# Patient Record
Sex: Female | Born: 1955 | Race: Black or African American | Hispanic: No | Marital: Single | Smoking: Current every day smoker
Health system: Southern US, Community
[De-identification: ages and names within clinical notes are randomized; demographics above are authoritative.]

## PROBLEM LIST (undated history)

## (undated) DIAGNOSIS — E785 Hyperlipidemia, unspecified: Secondary | ICD-10-CM

## (undated) DIAGNOSIS — M1990 Unspecified osteoarthritis, unspecified site: Secondary | ICD-10-CM

## (undated) DIAGNOSIS — T7840XA Allergy, unspecified, initial encounter: Secondary | ICD-10-CM

## (undated) DIAGNOSIS — J449 Chronic obstructive pulmonary disease, unspecified: Secondary | ICD-10-CM

## (undated) DIAGNOSIS — I10 Essential (primary) hypertension: Secondary | ICD-10-CM

## (undated) DIAGNOSIS — K5792 Diverticulitis of intestine, part unspecified, without perforation or abscess without bleeding: Secondary | ICD-10-CM

## (undated) DIAGNOSIS — H269 Unspecified cataract: Secondary | ICD-10-CM

## (undated) HISTORY — DX: Hyperlipidemia, unspecified: E78.5

## (undated) HISTORY — DX: Diverticulitis of intestine, part unspecified, without perforation or abscess without bleeding: K57.92

## (undated) HISTORY — DX: Allergy, unspecified, initial encounter: T78.40XA

## (undated) HISTORY — DX: Chronic obstructive pulmonary disease, unspecified: J44.9

## (undated) HISTORY — DX: Unspecified cataract: H26.9

## (undated) HISTORY — PX: ABDOMINAL HYSTERECTOMY: SHX81

## (undated) HISTORY — DX: Unspecified osteoarthritis, unspecified site: M19.90

---

## 1997-06-16 ENCOUNTER — Other Ambulatory Visit: Admission: RE | Admit: 1997-06-16 | Discharge: 1997-06-16 | Payer: Self-pay | Admitting: *Deleted

## 2000-05-11 ENCOUNTER — Other Ambulatory Visit: Admission: RE | Admit: 2000-05-11 | Discharge: 2000-05-11 | Payer: Self-pay | Admitting: *Deleted

## 2000-05-11 ENCOUNTER — Ambulatory Visit (HOSPITAL_COMMUNITY): Admission: RE | Admit: 2000-05-11 | Discharge: 2000-05-11 | Payer: Self-pay | Admitting: *Deleted

## 2000-05-11 ENCOUNTER — Encounter: Payer: Self-pay | Admitting: *Deleted

## 2000-08-14 ENCOUNTER — Encounter (INDEPENDENT_AMBULATORY_CARE_PROVIDER_SITE_OTHER): Payer: Self-pay | Admitting: Specialist

## 2000-08-14 ENCOUNTER — Other Ambulatory Visit: Admission: RE | Admit: 2000-08-14 | Discharge: 2000-08-14 | Payer: Self-pay | Admitting: *Deleted

## 2001-07-10 ENCOUNTER — Emergency Department (HOSPITAL_COMMUNITY): Admission: EM | Admit: 2001-07-10 | Discharge: 2001-07-10 | Payer: Self-pay | Admitting: Emergency Medicine

## 2001-07-10 ENCOUNTER — Encounter: Payer: Self-pay | Admitting: Emergency Medicine

## 2003-03-27 ENCOUNTER — Emergency Department (HOSPITAL_COMMUNITY): Admission: EM | Admit: 2003-03-27 | Discharge: 2003-03-28 | Payer: Self-pay | Admitting: Emergency Medicine

## 2004-01-24 ENCOUNTER — Ambulatory Visit (HOSPITAL_COMMUNITY): Admission: RE | Admit: 2004-01-24 | Discharge: 2004-01-24 | Payer: Self-pay | Admitting: Family Medicine

## 2006-03-03 HISTORY — PX: SHOULDER SURGERY: SHX246

## 2007-01-05 ENCOUNTER — Emergency Department (HOSPITAL_COMMUNITY): Admission: EM | Admit: 2007-01-05 | Discharge: 2007-01-05 | Payer: Self-pay | Admitting: Emergency Medicine

## 2007-02-12 ENCOUNTER — Ambulatory Visit (HOSPITAL_BASED_OUTPATIENT_CLINIC_OR_DEPARTMENT_OTHER): Admission: RE | Admit: 2007-02-12 | Discharge: 2007-02-12 | Payer: Self-pay | Admitting: Orthopedic Surgery

## 2007-05-01 ENCOUNTER — Emergency Department (HOSPITAL_COMMUNITY): Admission: EM | Admit: 2007-05-01 | Discharge: 2007-05-01 | Payer: Self-pay | Admitting: Family Medicine

## 2008-01-25 ENCOUNTER — Emergency Department (HOSPITAL_COMMUNITY): Admission: EM | Admit: 2008-01-25 | Discharge: 2008-01-25 | Payer: Self-pay | Admitting: Family Medicine
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
## 2010-11-22 LAB — INFLUENZA A AND B ANTIGEN (CONVERTED LAB)

## 2010-12-06 ENCOUNTER — Other Ambulatory Visit: Payer: Self-pay | Admitting: Family Medicine

## 2010-12-06 DIAGNOSIS — Z1231 Encounter for screening mammogram for malignant neoplasm of breast: Secondary | ICD-10-CM

## 2010-12-09 LAB — I-STAT 8, (EC8 V) (CONVERTED LAB)
BUN: 14
Bicarbonate: 25 — ABNORMAL HIGH
Hemoglobin: 14.3
Operator id: 279391
pCO2, Ven: 42.3 — ABNORMAL LOW
pH, Ven: 7.38 — ABNORMAL HIGH

## 2010-12-23 ENCOUNTER — Ambulatory Visit
Admission: RE | Admit: 2010-12-23 | Discharge: 2010-12-23 | Disposition: A | Discharge status: Home / Self Care | Payer: BC Managed Care – PPO | Source: Ambulatory Visit | Attending: Family Medicine | Admitting: Family Medicine

## 2010-12-23 DIAGNOSIS — Z1231 Encounter for screening mammogram for malignant neoplasm of breast: Secondary | ICD-10-CM

## 2010-12-25 ENCOUNTER — Other Ambulatory Visit: Payer: Self-pay | Admitting: Family Medicine

## 2010-12-25 DIAGNOSIS — R928 Other abnormal and inconclusive findings on diagnostic imaging of breast: Secondary | ICD-10-CM

## 2011-01-13 ENCOUNTER — Other Ambulatory Visit: Payer: BC Managed Care – PPO

## 2011-01-27 ENCOUNTER — Other Ambulatory Visit: Payer: BC Managed Care – PPO

## 2011-03-09 ENCOUNTER — Emergency Department (INDEPENDENT_AMBULATORY_CARE_PROVIDER_SITE_OTHER)
Admission: EM | Admit: 2011-03-09 | Discharge: 2011-03-09 | Disposition: A | Discharge status: Home / Self Care | Payer: BC Managed Care – PPO | Source: Home / Self Care | Attending: Emergency Medicine | Admitting: Emergency Medicine

## 2011-03-09 ENCOUNTER — Encounter: Payer: Self-pay | Admitting: Cardiology

## 2011-03-09 DIAGNOSIS — M62838 Other muscle spasm: Secondary | ICD-10-CM

## 2011-03-09 HISTORY — DX: Essential (primary) hypertension: I10

## 2011-03-09 MED ORDER — DIAZEPAM 5 MG PO TABS: 5.0000 mg | ORAL_TABLET | Freq: Four times a day (QID) | ORAL | Status: DC | PRN

## 2011-03-09 MED ORDER — DIAZEPAM 5 MG PO TABS: 5.0000 mg | ORAL_TABLET | Freq: Four times a day (QID) | ORAL | Status: AC | PRN

## 2011-03-09 NOTE — ED Notes (Signed)
Pt report left neck pain that shoots up to her head. This started this past Wed after lifting something at work. Neck has now become stiff. Seen on [place] [facility]. Given naproxen, flexeril, and vicodin scripts. Pt states she sleeps when she takes meds but when she wakes up the pain is just as bad.

## 2011-03-09 NOTE — ED Provider Notes (Signed)
History     CSN: [id number]  Arrival date & time 03/09/11  1549   First MD Initiated Contact with Patient 03/09/11 1552      Chief Complaint  Patient presents with  . Neck Pain    (Consider location/radiation/quality/duration/timing/severity/associated sxs/prior treatment) HPI Comments: Pt with 4 days left neck muscle pain, spasm after lifing heavy object at work. Radiates up to back of head. Seen at another urgent care thought to have cervical muscle strain, sent home with flexaril, vicodin, naprosyn which works temporarily but with no real improvement. No fevers, numbness, tingling, weakness in arm. Pain worse with head rotation to right, flexion/extension. No shoulder pain. No recent, remote h/o trauma to neck.  The history is provided by the patient.    Past Medical History  Diagnosis Date  . Hypertension   . Hematuria     Past Surgical History  Procedure Date  . Abdominal hysterectomy   . Shoulder surgery 2008    left    History reviewed. No pertinent family history.  History  Substance Use Topics  . Smoking status: Current Everyday Smoker -- 0.2 packs/day  . Smokeless tobacco: Not on file  . Alcohol Use: Yes     occasional    OB History    Grav Para Term Preterm Abortions TAB SAB Ect Mult Living                  Review of Systems  Constitutional: Negative for fever.  HENT: Negative.   Eyes: Negative.   Gastrointestinal: Negative for nausea.  Musculoskeletal: Positive for myalgias.  Skin: Negative for color change.  Neurological: Negative for weakness and numbness.    Allergies  Sulfa antibiotics  Home Medications   Current Outpatient Rx  Name Route Sig Dispense Refill  . HYDROCODONE-ACETAMINOPHEN 5-500 MG PO TABS Oral Take 1 tablet by mouth 2 (two) times daily as needed.      Marland Kitchen LISINOPRIL-HYDROCHLOROTHIAZIDE 20-25 MG PO TABS Oral Take 1 tablet by mouth daily.      Marland Kitchen NAPROXEN 500 MG PO TABS Oral Take 500 mg by mouth 2 (two) times daily with a  meal.      . DIAZEPAM 5 MG PO TABS Oral Take 1 tablet (5 mg total) by mouth every 6 (six) hours as needed (muscle spasm). 16 tablet 0    BP 136/90  Pulse 83  Temp(Src) 98.2 F (36.8 C) (Oral)  Resp 20  SpO2 100%  Physical Exam  Nursing note and vitals reviewed. Constitutional: She is oriented to person, place, and time. She appears well-developed and well-nourished. She appears distressed.       Appears moderately uncomfortable  HENT:  Head: Normocephalic and atraumatic.  Eyes: EOM are normal. Pupils are equal, round, and reactive to light.  Neck: Muscular tenderness present. No spinous process tenderness present. Decreased range of motion present. No mass and no thyromegaly present.    Cardiovascular: Normal rate and normal heart sounds.   Pulmonary/Chest: Effort normal.  Abdominal: She exhibits no distension.  Musculoskeletal:       L shoulder with ROM normal, Drop test normal, no Tenderness entire joint,  Motor strength normal  Sensation intact LT over deltoid region, distal NVI with hand on affected side having intact sensation and strength in the distribution of the median, radial, and ulnar nerve.   Lymphadenopathy:    She has no cervical adenopathy.  Neurological: She is alert and oriented to person, place, and time.  Skin: Skin is warm and dry.  Psychiatric: She has a normal mood and affect. Her behavior is normal. Judgment and thought content normal.    ED Course  Procedures (including critical care time)  Labs Reviewed - No data to display No results found.   1. Cervical paraspinal muscle spasm       MDM  [place] narcotic database queried last narcotic rx was for tylenol #3 in 05/2010  Pt appears uncomfortable but declined muscle relaxant here. Is taking a taxi home and does not want sedating medications until is at home.   No bony tenderness, no recent or remote h/o trauma. Pt with significant muscle spasm L trapezius otherwise shoulder, arm WNL. Deferring XR.  dcing flexaril starting valium, will have her continue naprosyn, norco.  Has soft collar at home. Will have her start wearing this.  Luiz Blare, MD 03/09/11 2149

## 2013-03-03 DIAGNOSIS — K5792 Diverticulitis of intestine, part unspecified, without perforation or abscess without bleeding: Secondary | ICD-10-CM

## 2013-03-03 HISTORY — PX: COLONOSCOPY: SHX174

## 2013-03-03 HISTORY — DX: Diverticulitis of intestine, part unspecified, without perforation or abscess without bleeding: K57.92

## 2013-04-22 LAB — HM COLONOSCOPY

## 2014-03-22 ENCOUNTER — Other Ambulatory Visit: Payer: Self-pay | Admitting: Internal Medicine

## 2014-03-22 DIAGNOSIS — Z1231 Encounter for screening mammogram for malignant neoplasm of breast: Secondary | ICD-10-CM

## 2014-04-26 ENCOUNTER — Ambulatory Visit
Admission: RE | Admit: 2014-04-26 | Discharge: 2014-04-26 | Disposition: A | Discharge status: Home / Self Care | Payer: Commercial Managed Care - HMO | Source: Ambulatory Visit | Attending: Internal Medicine | Admitting: Internal Medicine

## 2014-04-26 DIAGNOSIS — Z1231 Encounter for screening mammogram for malignant neoplasm of breast: Secondary | ICD-10-CM

## 2014-04-27 DIAGNOSIS — Z01419 Encounter for gynecological examination (general) (routine) without abnormal findings: Secondary | ICD-10-CM | POA: Diagnosis not present

## 2014-05-12 DIAGNOSIS — J45998 Other asthma: Secondary | ICD-10-CM | POA: Diagnosis not present

## 2014-05-12 DIAGNOSIS — M25562 Pain in left knee: Secondary | ICD-10-CM | POA: Diagnosis not present

## 2014-05-12 DIAGNOSIS — J019 Acute sinusitis, unspecified: Secondary | ICD-10-CM | POA: Diagnosis not present

## 2014-05-12 DIAGNOSIS — Z6829 Body mass index (BMI) 29.0-29.9, adult: Secondary | ICD-10-CM | POA: Diagnosis not present

## 2014-05-12 DIAGNOSIS — I10 Essential (primary) hypertension: Secondary | ICD-10-CM | POA: Diagnosis not present

## 2014-05-12 DIAGNOSIS — N951 Menopausal and female climacteric states: Secondary | ICD-10-CM | POA: Diagnosis not present

## 2014-05-12 DIAGNOSIS — F17200 Nicotine dependence, unspecified, uncomplicated: Secondary | ICD-10-CM | POA: Diagnosis not present

## 2014-08-28 DIAGNOSIS — Z6829 Body mass index (BMI) 29.0-29.9, adult: Secondary | ICD-10-CM | POA: Diagnosis not present

## 2014-08-28 DIAGNOSIS — M25551 Pain in right hip: Secondary | ICD-10-CM | POA: Diagnosis not present

## 2014-08-28 DIAGNOSIS — M76891 Other specified enthesopathies of right lower limb, excluding foot: Secondary | ICD-10-CM | POA: Diagnosis not present

## 2014-09-18 DIAGNOSIS — M25551 Pain in right hip: Secondary | ICD-10-CM | POA: Diagnosis not present

## 2014-10-04 DIAGNOSIS — M25551 Pain in right hip: Secondary | ICD-10-CM | POA: Diagnosis not present

## 2014-10-23 DIAGNOSIS — M25551 Pain in right hip: Secondary | ICD-10-CM | POA: Diagnosis not present

## 2014-10-31 DIAGNOSIS — M25551 Pain in right hip: Secondary | ICD-10-CM | POA: Diagnosis not present

## 2014-11-07 DIAGNOSIS — R070 Pain in throat: Secondary | ICD-10-CM | POA: Diagnosis not present

## 2014-11-07 DIAGNOSIS — Z6829 Body mass index (BMI) 29.0-29.9, adult: Secondary | ICD-10-CM | POA: Diagnosis not present

## 2014-11-07 DIAGNOSIS — M542 Cervicalgia: Secondary | ICD-10-CM | POA: Diagnosis not present

## 2015-03-21 DIAGNOSIS — R8290 Unspecified abnormal findings in urine: Secondary | ICD-10-CM | POA: Diagnosis not present

## 2015-03-21 DIAGNOSIS — I10 Essential (primary) hypertension: Secondary | ICD-10-CM | POA: Diagnosis not present

## 2015-03-28 DIAGNOSIS — Z1212 Encounter for screening for malignant neoplasm of rectum: Secondary | ICD-10-CM | POA: Diagnosis not present

## 2015-03-28 DIAGNOSIS — Z832 Family history of diseases of the blood and blood-forming organs and certain disorders involving the immune mechanism: Secondary | ICD-10-CM | POA: Diagnosis not present

## 2015-03-28 DIAGNOSIS — M25562 Pain in left knee: Secondary | ICD-10-CM | POA: Diagnosis not present

## 2015-03-28 DIAGNOSIS — Z1389 Encounter for screening for other disorder: Secondary | ICD-10-CM | POA: Diagnosis not present

## 2015-03-28 DIAGNOSIS — Z6829 Body mass index (BMI) 29.0-29.9, adult: Secondary | ICD-10-CM | POA: Diagnosis not present

## 2015-03-28 DIAGNOSIS — I10 Essential (primary) hypertension: Secondary | ICD-10-CM | POA: Diagnosis not present

## 2015-03-28 DIAGNOSIS — N951 Menopausal and female climacteric states: Secondary | ICD-10-CM | POA: Diagnosis not present

## 2015-03-28 DIAGNOSIS — Z0000 Encounter for general adult medical examination without abnormal findings: Secondary | ICD-10-CM | POA: Diagnosis not present

## 2015-03-28 DIAGNOSIS — F17200 Nicotine dependence, unspecified, uncomplicated: Secondary | ICD-10-CM | POA: Diagnosis not present

## 2015-04-26 DIAGNOSIS — H5203 Hypermetropia, bilateral: Secondary | ICD-10-CM | POA: Diagnosis not present

## 2015-04-26 DIAGNOSIS — H5210 Myopia, unspecified eye: Secondary | ICD-10-CM | POA: Diagnosis not present

## 2015-05-15 ENCOUNTER — Other Ambulatory Visit: Payer: Self-pay

## 2015-05-15 DIAGNOSIS — Z1231 Encounter for screening mammogram for malignant neoplasm of breast: Secondary | ICD-10-CM

## 2015-05-28 ENCOUNTER — Ambulatory Visit
Admission: RE | Admit: 2015-05-28 | Discharge: 2015-05-28 | Disposition: A | Discharge status: Home / Self Care | Payer: Commercial Managed Care - HMO | Source: Ambulatory Visit

## 2015-05-28 DIAGNOSIS — Z1231 Encounter for screening mammogram for malignant neoplasm of breast: Secondary | ICD-10-CM

## 2015-12-04 DIAGNOSIS — J209 Acute bronchitis, unspecified: Secondary | ICD-10-CM | POA: Diagnosis not present

## 2015-12-04 DIAGNOSIS — R05 Cough: Secondary | ICD-10-CM | POA: Diagnosis not present

## 2015-12-04 DIAGNOSIS — Z6828 Body mass index (BMI) 28.0-28.9, adult: Secondary | ICD-10-CM | POA: Diagnosis not present

## 2016-03-07 ENCOUNTER — Ambulatory Visit (INDEPENDENT_AMBULATORY_CARE_PROVIDER_SITE_OTHER)
Admission: RE | Admit: 2016-03-07 | Discharge: 2016-03-07 | Disposition: A | Discharge status: Home / Self Care | Payer: Medicare HMO | Source: Ambulatory Visit | Attending: Nurse Practitioner | Admitting: Nurse Practitioner

## 2016-03-07 ENCOUNTER — Ambulatory Visit (INDEPENDENT_AMBULATORY_CARE_PROVIDER_SITE_OTHER): Payer: Medicare HMO | Admitting: Nurse Practitioner

## 2016-03-07 ENCOUNTER — Other Ambulatory Visit (INDEPENDENT_AMBULATORY_CARE_PROVIDER_SITE_OTHER): Payer: Medicare HMO

## 2016-03-07 ENCOUNTER — Encounter: Payer: Self-pay | Admitting: Nurse Practitioner

## 2016-03-07 VITALS — BP 122/76 | HR 72 | Temp 98.2°F | Ht 67.0 in | Wt 183.0 lb

## 2016-03-07 DIAGNOSIS — Z780 Asymptomatic menopausal state: Secondary | ICD-10-CM

## 2016-03-07 DIAGNOSIS — G8929 Other chronic pain: Secondary | ICD-10-CM

## 2016-03-07 DIAGNOSIS — R739 Hyperglycemia, unspecified: Secondary | ICD-10-CM | POA: Diagnosis not present

## 2016-03-07 DIAGNOSIS — M25561 Pain in right knee: Secondary | ICD-10-CM

## 2016-03-07 DIAGNOSIS — Z136 Encounter for screening for cardiovascular disorders: Secondary | ICD-10-CM

## 2016-03-07 DIAGNOSIS — I10 Essential (primary) hypertension: Secondary | ICD-10-CM

## 2016-03-07 DIAGNOSIS — R7303 Prediabetes: Secondary | ICD-10-CM | POA: Insufficient documentation

## 2016-03-07 DIAGNOSIS — M25562 Pain in left knee: Secondary | ICD-10-CM

## 2016-03-07 DIAGNOSIS — Z13220 Encounter for screening for lipoid disorders: Secondary | ICD-10-CM

## 2016-03-07 DIAGNOSIS — Z0001 Encounter for general adult medical examination with abnormal findings: Secondary | ICD-10-CM

## 2016-03-07 DIAGNOSIS — R51 Headache: Secondary | ICD-10-CM

## 2016-03-07 DIAGNOSIS — F411 Generalized anxiety disorder: Secondary | ICD-10-CM | POA: Insufficient documentation

## 2016-03-07 DIAGNOSIS — R519 Headache, unspecified: Secondary | ICD-10-CM | POA: Insufficient documentation

## 2016-03-07 DIAGNOSIS — Z1231 Encounter for screening mammogram for malignant neoplasm of breast: Secondary | ICD-10-CM

## 2016-03-07 DIAGNOSIS — F17200 Nicotine dependence, unspecified, uncomplicated: Secondary | ICD-10-CM | POA: Diagnosis not present

## 2016-03-07 DIAGNOSIS — M179 Osteoarthritis of knee, unspecified: Secondary | ICD-10-CM | POA: Insufficient documentation

## 2016-03-07 DIAGNOSIS — M1710 Unilateral primary osteoarthritis, unspecified knee: Secondary | ICD-10-CM | POA: Insufficient documentation

## 2016-03-07 LAB — CBC WITH DIFFERENTIAL/PLATELET
BASOS ABS: 0 10*3/uL (ref 0.0–0.1)
Basophils Relative: 0.2 % (ref 0.0–3.0)
EOS ABS: 0.1 10*3/uL (ref 0.0–0.7)
Eosinophils Relative: 1.1 % (ref 0.0–5.0)
HCT: 40.8 % (ref 36.0–46.0)
Hemoglobin: 13.9 g/dL (ref 12.0–15.0)
LYMPHS ABS: 3 10*3/uL (ref 0.7–4.0)
LYMPHS PCT: 29.8 % (ref 12.0–46.0)
MCHC: 34.2 g/dL (ref 30.0–36.0)
MCV: 89.9 fl (ref 78.0–100.0)
MONOS PCT: 4 % (ref 3.0–12.0)
Monocytes Absolute: 0.4 10*3/uL (ref 0.1–1.0)
NEUTROS ABS: 6.6 10*3/uL (ref 1.4–7.7)
NEUTROS PCT: 64.9 % (ref 43.0–77.0)
PLATELETS: 238 10*3/uL (ref 150.0–400.0)
RBC: 4.54 Mil/uL (ref 3.87–5.11)
RDW: 13.7 % (ref 11.5–15.5)
WBC: 10.1 10*3/uL (ref 4.0–10.5)

## 2016-03-07 LAB — COMPREHENSIVE METABOLIC PANEL
ALK PHOS: 92 U/L (ref 39–117)
ALT: 8 U/L (ref 0–35)
AST: 9 U/L (ref 0–37)
Albumin: 4.5 g/dL (ref 3.5–5.2)
BILIRUBIN TOTAL: 0.6 mg/dL (ref 0.2–1.2)
BUN: 12 mg/dL (ref 6–23)
CO2: 28 meq/L (ref 19–32)
CREATININE: 0.63 mg/dL (ref 0.40–1.20)
Calcium: 9.6 mg/dL (ref 8.4–10.5)
Chloride: 106 mEq/L (ref 96–112)
GFR: 124.14 mL/min (ref >60.00)
GLUCOSE: 100 mg/dL — AB (ref 70–99)
Potassium: 4 mEq/L (ref 3.5–5.1)
Sodium: 142 mEq/L (ref 135–145)
TOTAL PROTEIN: 7.6 g/dL (ref 6.0–8.3)

## 2016-03-07 LAB — HEMOGLOBIN A1C: HEMOGLOBIN A1C: 5.7 % (ref 4.6–6.5)

## 2016-03-07 LAB — LIPID PANEL
CHOL/HDL RATIO: 4
Cholesterol: 189 mg/dL (ref 0–200)
HDL: 47.5 mg/dL (ref >39.00)
LDL CALC: 113 mg/dL — AB (ref 0–99)
NONHDL: 141.83
Triglycerides: 144 mg/dL (ref 0.0–149.0)
VLDL: 28.8 mg/dL (ref 0.0–40.0)

## 2016-03-07 LAB — TSH: TSH: 1.19 u[IU]/mL (ref 0.35–4.50)

## 2016-03-07 LAB — HEPATITIS C ANTIBODY: HCV AB: NEGATIVE

## 2016-03-07 MED ORDER — BUTALBITAL-APAP-CAFFEINE 50-325-40 MG PO TABS: 1.0000 | ORAL_TABLET | ORAL | 0 refills | Status: DC | PRN

## 2016-03-07 NOTE — Progress Notes (Signed)
Subjective:    Patient ID: Anne Palmer, female    DOB: 03-31-1955, 61 y.o.   MRN: [id number]  Patient presents today for complete physical and establish care (new patient).  HPI  Previous pcp was Dr. Velna Hatchet with [facility].   no refill needed.   will like referral for dexa scan.  Headache:  Uncontrolled with tylenol 8tabs per day. Ongoing for several years. No change in charateristics. Headache in temples and behind eyes. Headache worse in morning. Had head CT 1980s which was normal showed chronic sinusitis per patient.  HTN: controlled with lisinopril HCTZ.   Immunizations: (TDAP, Hep C screen, Pneumovax, Influenza, zoster)  Health Maintenance  Topic Date Due  .  Hepatitis C: One time screening is recommended by Center for Disease Control  (CDC) for  adults born from [birth year] through 1964.   [birth year]  . Colon Cancer Screening  08/05/2006  . Flu Shot  10/02/2015  . HIV Screening  03/06/2017*  . Mammogram  05/27/2017  . Tetanus Vaccine  07/01/2021  . Pap Smear  Excluded  *Topic was postponed. The date shown is not the original due date.   Diet:regular Weight:  Wt Readings from Last 3 Encounters:  03/07/16 183 lb (83 kg)   Exercise:none Fall Risk: Fall Risk  03/07/2016  Falls in the past year? No   Home Safety:home with grand child Depression/Suicide: Depression screen [facility] 2/9 03/07/2016  Decreased Interest 0  Down, Depressed, Hopeless 0  PHQ - 2 Score 0   No flowsheet data found. Colonoscopy (every 5-80yr, >50-778yr:[facility] 2015 [facility] Mammogram (yearly, >4593yrneeded Vision: last done 04/2015, corrective lens Dental: use of dentures Advanced Directive:declined No flowsheet data found. Sexual History (birth control, marital status, STD):single, not sexually active  Medications and allergies reviewed with patient and updated if appropriate.  Patient Active Problem List   Diagnosis Date Noted   . HTN (hypertension) 03/07/2016  . Headache around the eyes 03/07/2016  . Bilateral chronic knee pain 03/07/2016  . Postmenopausal state 03/07/2016  . Hyperglycemia 03/07/2016  . Tobacco use disorder 03/07/2016    Current Outpatient Prescriptions on File Prior to Visit  Medication Sig Dispense Refill  . HYDROcodone-acetaminophen (VICODIN) 5-500 MG per tablet Take 1 tablet by mouth 2 (two) times daily as needed.      . lMarland Kitchensinopril-hydrochlorothiazide (PRINZIDE,ZESTORETIC) 20-25 MG per tablet Take 1 tablet by mouth daily.      . naproxen (NAPROSYN) 500 MG tablet Take 500 mg by mouth 2 (two) times daily with a meal.       No current facility-administered medications on file prior to visit.     Past Medical History:  Diagnosis Date  . Arthritis   . Diverticulitis 2015  . Hematuria   . Hypertension     Past Surgical History:  Procedure Laterality Date  . ABDOMINAL HYSTERECTOMY     secondary to endometriosis and fibroids  . COLONOSCOPY  2015   no polyps, diverticulosis  . SHOULDER SURGERY  2008   Palmer    Social History   Social History  . Marital status: Single    Spouse name: N/A  . Number of children: N/A  . Years of education: N/A   Social History Main Topics  . Smoking status: Current Every Day Smoker    Packs/day: 0.25  . Smokeless tobacco: Never Used  . Alcohol use Yes     Comment: occasional  . Drug use: No  . Sexual activity:  Not Currently    Birth control/ protection: None   Other Topics Concern  . None   Social History Narrative  . None    Family History  Problem Relation Age of Onset  . Arthritis Mother   . Stroke Mother   . Hypertension Mother   . Diabetes Mother   . Alcohol abuse Father   . Arthritis Sister   . Hyperlipidemia Sister   . Diabetes Sister   . Lupus Sister   . Arthritis Brother   . Diabetes Brother         Review of Systems  Constitutional: Negative for fever, malaise/fatigue and weight loss.  HENT: Negative for  congestion and sore throat.   Eyes:       Negative for visual changes  Respiratory: Negative for cough and shortness of breath.   Cardiovascular: Negative for chest pain, palpitations and leg swelling.  Gastrointestinal: Negative for blood in stool, constipation, diarrhea and heartburn.  Genitourinary: Negative for dysuria, frequency and urgency.  Musculoskeletal: Positive for joint pain. Negative for falls and myalgias.       Chronic knee pain  Skin: Negative for rash.  Neurological: Positive for headaches. Negative for dizziness and sensory change.       Chronic  Endo/Heme/Allergies: Does not bruise/bleed easily.  Psychiatric/Behavioral: Negative for depression, substance abuse and suicidal ideas. The patient is not nervous/anxious.     Objective:   Vitals:   03/07/16 0913  BP: 122/76  Pulse: 72  Temp: 98.2 F (36.8 C)    Body mass index is 28.66 kg/m.   Physical Examination:  Physical Exam  Constitutional: She is oriented to person, place, and time and well-developed, well-nourished, and in no distress. No distress.  HENT:  Right Ear: External ear normal.  Palmer Ear: External ear normal.  Nose: Nose normal.  Mouth/Throat: No oropharyngeal exudate.  Eyes: Conjunctivae and EOM are normal. Pupils are equal, round, and reactive to light. No scleral icterus.  Neck: Normal range of motion. Neck supple. No thyromegaly present.  Cardiovascular: Normal rate, regular rhythm, normal heart sounds and intact distal pulses.   Pulmonary/Chest: Effort normal and breath sounds normal. Right breast exhibits no inverted nipple, no mass, no nipple discharge and no skin change. Palmer breast exhibits no inverted nipple, no mass, no nipple discharge and no skin change. Breasts are symmetrical.  Abdominal: Soft. Bowel sounds are normal. She exhibits no distension. There is no tenderness.  Genitourinary: Rectum normal and vulva normal. Cervix exhibits no motion tenderness.  Genitourinary  Comments: Deferred by patient  Musculoskeletal: Normal range of motion. She exhibits no edema or tenderness.  Lymphadenopathy:    She has no cervical adenopathy.  Neurological: She is alert and oriented to person, place, and time. Gait normal.  Skin: Skin is warm and dry.  Psychiatric: Affect and judgment normal.  Vitals reviewed.   ASSESSMENT and PLAN:  Randalyn was seen today for establish care.  Diagnoses and all orders for this visit:  Encounter for preventative adult health care exam with abnormal findings -     CBC w/Diff; Future -     Comp Met (CMET); Future -     TSH; Future -     Lipid panel; Future -     MM Digital Screening; Future -     DG Bone Density; Future -     Hepatitis C Antibody; Future  Essential hypertension -     Comp Met (CMET); Future  Postmenopausal state -     DG  Bone Density; Future  Encounter for lipid screening for cardiovascular disease -     Lipid panel; Future  Encounter for screening mammogram for breast cancer -     MM Digital Screening; Future  Hyperglycemia -     TSH; Future -     Hemoglobin A1c; Future  Tobacco use disorder  Headache around the eyes -     butalbital-acetaminophen-caffeine (FIORICET, ESGIC) 50-325-40 MG tablet; Take 1 tablet by mouth every 4 (four) hours as needed for headache.    No problem-specific Assessment & Plan notes found for this encounter.      Follow up: Return in about 3 months (around 06/05/2016) for HTN, hyperlipidemia.  Wilfred Lacy, NP

## 2016-03-07 NOTE — Progress Notes (Signed)
Pre visit review using our clinic review tool, if applicable. No additional management support is needed unless otherwise documented below in the visit note. 

## 2016-03-07 NOTE — Patient Instructions (Signed)
Sign medical release to obtain records from previous pcp and colonoscopy report.  Go to basement for lab draw. You will be called with results.  I will send prescription for headache after review of lab results.

## 2016-03-09 ENCOUNTER — Encounter: Payer: Self-pay | Admitting: Nurse Practitioner

## 2016-03-10 ENCOUNTER — Other Ambulatory Visit: Payer: Self-pay | Admitting: Nurse Practitioner

## 2016-03-10 ENCOUNTER — Encounter: Payer: Self-pay | Admitting: Nurse Practitioner

## 2016-03-10 DIAGNOSIS — R51 Headache: Principal | ICD-10-CM

## 2016-03-10 DIAGNOSIS — R519 Headache, unspecified: Secondary | ICD-10-CM

## 2016-03-10 MED ORDER — BUTALBITAL-ACETAMINOPHEN 50-650 MG PO TABS: 1.0000 | ORAL_TABLET | Freq: Four times a day (QID) | ORAL | 1 refills | Status: DC | PRN

## 2016-03-10 NOTE — Progress Notes (Signed)
Normal results, see office note

## 2016-03-11 ENCOUNTER — Telehealth: Payer: Self-pay

## 2016-03-11 ENCOUNTER — Encounter: Payer: Self-pay | Admitting: Nurse Practitioner

## 2016-03-11 ENCOUNTER — Other Ambulatory Visit: Payer: Self-pay | Admitting: Nurse Practitioner

## 2016-03-11 DIAGNOSIS — R519 Headache, unspecified: Secondary | ICD-10-CM

## 2016-03-11 DIAGNOSIS — R51 Headache: Principal | ICD-10-CM

## 2016-03-11 MED ORDER — BUTALBITAL-ACETAMINOPHEN 50-650 MG PO TABS: 1.0000 | ORAL_TABLET | Freq: Four times a day (QID) | ORAL | 0 refills | Status: DC | PRN

## 2016-03-11 NOTE — Telephone Encounter (Signed)
Rec'd  call from pt c/o acetaminophen-butalbital refill stating that [facility] said they did not have rx. Called [facility] spoke w/rep she stated she fax office stating they received script for Acetaminophen-Butalbital 50/650 mg, but it does not come in that strength. They have generic Fioricet 50-325-40-30 mg. Is this ok to change...Raechel Chute/lmb

## 2016-03-11 NOTE — Telephone Encounter (Signed)
Patient sent email request for refill on acetainophen-butalbital----has been faxed to [facility] [address]

## 2016-03-11 NOTE — Telephone Encounter (Signed)
Patient states fiorcet is expensive. What strength of BUPAP do they have?

## 2016-03-11 NOTE — Telephone Encounter (Signed)
Called pharmacy spoke w/rep they have BuPAP 50-300 mg...Raechel Chute/lmb

## 2016-03-12 ENCOUNTER — Encounter: Payer: Self-pay | Admitting: Nurse Practitioner

## 2016-03-12 MED ORDER — BUTALBITAL-ACETAMINOPHEN 50-300 MG PO TABS: 1.0000 | ORAL_TABLET | Freq: Four times a day (QID) | ORAL | 0 refills | Status: DC | PRN

## 2016-03-12 NOTE — Telephone Encounter (Signed)
Prescription changed

## 2016-03-12 NOTE — Telephone Encounter (Signed)
Rec'd call back from pt checking status on headache medication...Raechel Chute/lmb

## 2016-03-13 NOTE — Telephone Encounter (Signed)
Notified pt new rx was sent to [facility] yesterday pt states she was able to pick med up last night...Raechel Chute/lmb

## 2016-03-17 ENCOUNTER — Ambulatory Visit: Payer: Medicare HMO | Admitting: Nurse Practitioner

## 2016-05-14 DIAGNOSIS — H524 Presbyopia: Secondary | ICD-10-CM | POA: Diagnosis not present

## 2016-06-02 ENCOUNTER — Ambulatory Visit
Admission: RE | Admit: 2016-06-02 | Discharge: 2016-06-02 | Disposition: A | Discharge status: Home / Self Care | Payer: Medicare HMO | Source: Ambulatory Visit | Attending: Nurse Practitioner | Admitting: Nurse Practitioner

## 2016-06-02 DIAGNOSIS — Z1231 Encounter for screening mammogram for malignant neoplasm of breast: Secondary | ICD-10-CM

## 2016-06-02 DIAGNOSIS — Z0001 Encounter for general adult medical examination with abnormal findings: Secondary | ICD-10-CM

## 2016-06-05 ENCOUNTER — Encounter: Payer: Self-pay | Admitting: Nurse Practitioner

## 2016-06-05 ENCOUNTER — Ambulatory Visit (INDEPENDENT_AMBULATORY_CARE_PROVIDER_SITE_OTHER): Payer: Medicare HMO | Admitting: Nurse Practitioner

## 2016-06-05 VITALS — BP 120/72 | HR 64 | Temp 98.1°F | Ht 67.0 in | Wt 183.0 lb

## 2016-06-05 DIAGNOSIS — J324 Chronic pansinusitis: Secondary | ICD-10-CM | POA: Diagnosis not present

## 2016-06-05 DIAGNOSIS — I10 Essential (primary) hypertension: Secondary | ICD-10-CM | POA: Diagnosis not present

## 2016-06-05 DIAGNOSIS — Z780 Asymptomatic menopausal state: Secondary | ICD-10-CM

## 2016-06-05 DIAGNOSIS — J40 Bronchitis, not specified as acute or chronic: Secondary | ICD-10-CM

## 2016-06-05 DIAGNOSIS — F17200 Nicotine dependence, unspecified, uncomplicated: Secondary | ICD-10-CM

## 2016-06-05 DIAGNOSIS — J329 Chronic sinusitis, unspecified: Secondary | ICD-10-CM | POA: Insufficient documentation

## 2016-06-05 MED ORDER — VENLAFAXINE HCL ER 37.5 MG PO CP24: 37.5000 mg | ORAL_CAPSULE | Freq: Every day | ORAL | 1 refills | Status: DC

## 2016-06-05 MED ORDER — FLUTICASONE PROPIONATE 50 MCG/ACT NA SUSP: 2.0000 | Freq: Every day | NASAL | 6 refills | Status: DC

## 2016-06-05 MED ORDER — OXYMETAZOLINE HCL 0.05 % NA SOLN: 1.0000 | Freq: Two times a day (BID) | NASAL | 0 refills | Status: DC

## 2016-06-05 MED ORDER — ALBUTEROL SULFATE HFA 108 (90 BASE) MCG/ACT IN AERS: 2.0000 | INHALATION_SPRAY | Freq: Four times a day (QID) | RESPIRATORY_TRACT | 2 refills | Status: DC | PRN

## 2016-06-05 MED ORDER — DM-GUAIFENESIN ER 30-600 MG PO TB12: 1.0000 | ORAL_TABLET | Freq: Two times a day (BID) | ORAL | 0 refills | Status: DC | PRN

## 2016-06-05 MED ORDER — LISINOPRIL-HYDROCHLOROTHIAZIDE 20-12.5 MG PO TABS: 1.0000 | ORAL_TABLET | Freq: Every day | ORAL | 1 refills | Status: DC

## 2016-06-05 NOTE — Progress Notes (Signed)
Pre visit review using our clinic review tool, if applicable. No additional management support is needed unless otherwise documented below in the visit note. 

## 2016-06-05 NOTE — Progress Notes (Signed)
Subjective:  Patient ID: Anne Palmer, female    DOB: April 30, 1955  Age: 61 y.o. MRN: [id number]  CC: Follow-up (3 mo fu/sinus?)   Sinusitis  This is a recurrent problem. The problem has been waxing and waning since onset. There has been no fever. Associated symptoms include congestion, coughing, sinus pressure and sneezing. Pertinent negatives include no chills, diaphoresis, ear pain, headaches, hoarse voice, neck pain, shortness of breath, sore throat or swollen glands. Past treatments include nothing.    HTN: Controlled with lisinopril HCTZ.  Effexor is use for postmenopausal symptoms. Denies any side effects.   Headache: Controlled with prn use of Fioricet.  Outpatient Medications Prior to Visit  Medication Sig Dispense Refill  . Butalbital-Acetaminophen 50-300 MG TABS Take 1 tablet by mouth 4 (four) times daily as needed. 60 tablet 0  . lisinopril-hydrochlorothiazide (PRINZIDE,ZESTORETIC) 20-12.5 MG tablet Take by mouth daily.     Marland Kitchen venlafaxine XR (EFFEXOR-XR) 37.5 MG 24 hr capsule Take 37.5 mg by mouth daily with breakfast.    . HYDROcodone-acetaminophen (VICODIN) 5-500 MG per tablet Take 1 tablet by mouth 2 (two) times daily as needed.      Marland Kitchen lisinopril-hydrochlorothiazide (PRINZIDE,ZESTORETIC) 20-25 MG per tablet Take 1 tablet by mouth daily.      . naproxen (NAPROSYN) 500 MG tablet Take 500 mg by mouth 2 (two) times daily with a meal.       No facility-administered medications prior to visit.     ROS See HPI  Objective:  BP 120/72   Pulse 64   Temp 98.1 F (36.7 C)   Ht  (1.702 m)   Wt 183 lb (83 kg)   SpO2 99%   BMI 28.66 kg/m   BP Readings from Last 3 Encounters:  06/05/16 120/72  03/07/16 122/76  03/09/11 136/90    Wt Readings from Last 3 Encounters:  06/05/16 183 lb (83 kg)  03/07/16 183 lb (83 kg)    Physical Exam  Constitutional: She is oriented to person, place, and time.  HENT:  Right Ear: Tympanic membrane, external ear and ear canal  normal.  Left Ear: Tympanic membrane, external ear and ear canal normal.  Nose: Mucosal edema and rhinorrhea present. Right sinus exhibits maxillary sinus tenderness. Right sinus exhibits no frontal sinus tenderness. Left sinus exhibits maxillary sinus tenderness. Left sinus exhibits no frontal sinus tenderness.  Mouth/Throat: Uvula is midline. No trismus in the jaw. Posterior oropharyngeal erythema present. No oropharyngeal exudate.  Eyes: No scleral icterus.  Neck: Normal range of motion. Neck supple.  Cardiovascular: Normal rate and normal heart sounds.   Pulmonary/Chest: Effort normal and breath sounds normal.  Musculoskeletal: She exhibits no edema.  Lymphadenopathy:    She has no cervical adenopathy.  Neurological: She is alert and oriented to person, place, and time.  Vitals reviewed.   Lab Results  Component Value Date   WBC 10.1 03/07/2016   HGB 13.9 03/07/2016   HCT 40.8 03/07/2016   PLT 238.0 03/07/2016   GLUCOSE 100 (H) 03/07/2016   CHOL 189 03/07/2016   TRIG 144.0 03/07/2016   HDL 47.50 03/07/2016   LDLCALC 113 (H) 03/07/2016   ALT 8 03/07/2016   AST 9 03/07/2016   NA 142 03/07/2016   K 4.0 03/07/2016   CL 106 03/07/2016   CREATININE 0.63 03/07/2016   BUN 12 03/07/2016   CO2 28 03/07/2016   TSH 1.19 03/07/2016   HGBA1C 5.7 03/07/2016    Mm Digital Screening  Result Date: 06/02/2016 CLINICAL DATA:  Screening. EXAM: DIGITAL SCREENING BILATERAL MAMMOGRAM WITH CAD COMPARISON:  Previous exam(s). ACR Breast Density Category b: There are scattered areas of fibroglandular density. FINDINGS: There are no findings suspicious for malignancy. Images were processed with CAD. IMPRESSION: No mammographic evidence of malignancy. A result letter of this screening mammogram will be mailed directly to the patient. RECOMMENDATION: Screening mammogram in one year. (Code:SM-B-01Y) BI-RADS CATEGORY  1: Negative. Electronically Signed   By: Amie Portland M.D.   On: 06/02/2016 15:41     Assessment & Plan:   Anne Palmer was seen today for follow-up.  Diagnoses and all orders for this visit:  Bronchitis -     albuterol (PROVENTIL HFA;VENTOLIN HFA) 108 (90 Base) MCG/ACT inhaler; Inhale 2 puffs into the lungs every 6 (six) hours as needed for wheezing or shortness of breath.  Chronic pansinusitis -     fluticasone (FLONASE) 50 MCG/ACT nasal spray; Place 2 sprays into both nostrils daily. -     dextromethorphan-guaiFENesin (MUCINEX DM) 30-600 MG 12hr tablet; Take 1 tablet by mouth 2 (two) times daily as needed for cough. -     oxymetazoline (AFRIN NASAL SPRAY) 0.05 % nasal spray; Place 1 spray into both nostrils 2 (two) times daily. Use only for 3days, then stop -     albuterol (PROVENTIL HFA;VENTOLIN HFA) 108 (90 Base) MCG/ACT inhaler; Inhale 2 puffs into the lungs every 6 (six) hours as needed for wheezing or shortness of breath.  Tobacco use disorder  Essential hypertension -     lisinopril-hydrochlorothiazide (PRINZIDE,ZESTORETIC) 20-12.5 MG tablet; Take 1 tablet by mouth daily.  Postmenopausal state -     venlafaxine XR (EFFEXOR-XR) 37.5 MG 24 hr capsule; Take 1 capsule (37.5 mg total) by mouth daily with breakfast.   I have discontinued Anne Palmer's lisinopril-hydrochlorothiazide, naproxen, and HYDROcodone-acetaminophen. I have also changed her lisinopril-hydrochlorothiazide and venlafaxine XR. Additionally, I am having her start on fluticasone, dextromethorphan-guaiFENesin, oxymetazoline, and albuterol. Lastly, I am having her maintain her Butalbital-Acetaminophen.  Meds ordered this encounter  Medications  . fluticasone (FLONASE) 50 MCG/ACT nasal spray    Sig: Place 2 sprays into both nostrils daily.    Dispense:  16 g    Refill:  6    Order Specific Question:   Supervising Provider    Answer:   Tresa Garter [[id number]]  . dextromethorphan-guaiFENesin (MUCINEX DM) 30-600 MG 12hr tablet    Sig: Take 1 tablet by mouth 2 (two) times daily as needed for cough.     Dispense:  14 tablet    Refill:  0    Order Specific Question:   Supervising Provider    Answer:   Tresa Garter [[id number]]  . oxymetazoline (AFRIN NASAL SPRAY) 0.05 % nasal spray    Sig: Place 1 spray into both nostrils 2 (two) times daily. Use only for 3days, then stop    Dispense:  30 mL    Refill:  0    Order Specific Question:   Supervising Provider    Answer:   Tresa Garter [[id number]]  . albuterol (PROVENTIL HFA;VENTOLIN HFA) 108 (90 Base) MCG/ACT inhaler    Sig: Inhale 2 puffs into the lungs every 6 (six) hours as needed for wheezing or shortness of breath.    Dispense:  1 Inhaler    Refill:  2    Order Specific Question:   Supervising Provider    Answer:   Tresa Garter [[id number]]  . lisinopril-hydrochlorothiazide (PRINZIDE,ZESTORETIC) 20-12.5 MG tablet    Sig: Take 1 tablet by  mouth daily.    Dispense:  90 tablet    Refill:  1    Order Specific Question:   Supervising Provider    Answer:   Tresa Garter [[id number]]  . venlafaxine XR (EFFEXOR-XR) 37.5 MG 24 hr capsule    Sig: Take 1 capsule (37.5 mg total) by mouth daily with breakfast.    Dispense:  90 capsule    Refill:  1    Order Specific Question:   Supervising Provider    Answer:   Tresa Garter [[id number]]    Follow-up: Return in about 6 months (around 12/05/2016) for HTN and , hyperlipidemia (fasting , labs needed).  Alysia Penna, NP

## 2016-06-05 NOTE — Assessment & Plan Note (Signed)
1/4 ppd ?

## 2016-06-05 NOTE — Patient Instructions (Addendum)
Please sign medical release form to get colonoscopy records from previous GI provider.  encourage to quit tobacco use.  Encourage adequate oral hydration.   Sinusitis, Adult Sinusitis is soreness and inflammation of your sinuses. Sinuses are hollow spaces in the bones around your face. Your sinuses are located:  Around your eyes.  In the middle of your forehead.  Behind your nose.  In your cheekbones. Your sinuses and nasal passages are lined with a stringy fluid (mucus). Mucus normally drains out of your sinuses. When your nasal tissues become inflamed or swollen, the mucus can become trapped or blocked so air cannot flow through your sinuses. This allows bacteria, viruses, and funguses to grow, which leads to infection. Sinusitis can develop quickly and last for 7?10 days (acute) or for more than 12 weeks (chronic). Sinusitis often develops after a cold. What are the causes? This condition is caused by anything that creates swelling in the sinuses or stops mucus from draining, including:  Allergies.  Asthma.  Bacterial or viral infection.  Abnormally shaped bones between the nasal passages.  Nasal growths that contain mucus (nasal polyps).  Narrow sinus openings.  Pollutants, such as chemicals or irritants in the air.  A foreign object stuck in the nose.  A fungal infection. This is rare. What increases the risk? The following factors may make you more likely to develop this condition:  Having allergies or asthma.  Having had a recent cold or respiratory tract infection.  Having structural deformities or blockages in your nose or sinuses.  Having a weak immune system.  Doing a lot of swimming or diving.  Overusing nasal sprays.  Smoking. What are the signs or symptoms? The main symptoms of this condition are pain and a feeling of pressure around the affected sinuses. Other symptoms include:  Upper toothache.  Earache.  Headache.  Bad  breath.  Decreased sense of smell and taste.  A cough that may get worse at night.  Fatigue.  Fever.  Thick drainage from your nose. The drainage is often green and it may contain pus (purulent).  Stuffy nose or congestion.  Postnasal drip. This is when extra mucus collects in the throat or back of the nose.  Swelling and warmth over the affected sinuses.  Sore throat.  Sensitivity to light. How is this diagnosed? This condition is diagnosed based on symptoms, a medical history, and a physical exam. To find out if your condition is acute or chronic, your health care provider may:  Look in your nose for signs of nasal polyps.  Tap over the affected sinus to check for signs of infection.  View the inside of your sinuses using an imaging device that has a light attached (endoscope). If your health care provider suspects that you have chronic sinusitis, you may also:  Be tested for allergies.  Have a sample of mucus taken from your nose (nasal culture) and checked for bacteria.  Have a mucus sample examined to see if your sinusitis is related to an allergy. If your sinusitis does not respond to treatment and it lasts longer than 8 weeks, you may have an MRI or CT scan to check your sinuses. These scans also help to determine how severe your infection is. In rare cases, a bone biopsy may be done to rule out more serious types of fungal sinus disease. How is this treated? Treatment for sinusitis depends on the cause and whether your condition is chronic or acute. If a virus is causing your sinusitis,  your symptoms will go away on their own within 10 days. You may be given medicines to relieve your symptoms, including:  Topical nasal decongestants. They shrink swollen nasal passages and let mucus drain from your sinuses.  Antihistamines. These drugs block inflammation that is triggered by allergies. This can help to ease swelling in your nose and sinuses.  Topical nasal  corticosteroids. These are nasal sprays that ease inflammation and swelling in your nose and sinuses.  Nasal saline washes. These rinses can help to get rid of thick mucus in your nose. If your condition is caused by bacteria, you will be given an antibiotic medicine. If your condition is caused by a fungus, you will be given an antifungal medicine. Surgery may be needed to correct underlying conditions, such as narrow nasal passages. Surgery may also be needed to remove polyps. Follow these instructions at home: Medicines   Take, use, or apply over-the-counter and prescription medicines only as told by your health care provider. These may include nasal sprays.  If you were prescribed an antibiotic medicine, take it as told by your health care provider. Do not stop taking the antibiotic even if you start to feel better. Hydrate and Humidify   Drink enough water to keep your urine clear or pale yellow. Staying hydrated will help to thin your mucus.  Use a cool mist humidifier to keep the humidity level in your home above 50%.  Inhale steam for 10-15 minutes, 3-4 times a day or as told by your health care provider. You can do this in the bathroom while a hot shower is running.  Limit your exposure to cool or dry air. Rest   Rest as much as possible.  Sleep with your head raised (elevated).  Make sure to get enough sleep each night. General instructions   Apply a warm, moist washcloth to your face 3-4 times a day or as told by your health care provider. This will help with discomfort.  Wash your hands often with soap and water to reduce your exposure to viruses and other germs. If soap and water are not available, use hand sanitizer.  Do not smoke. Avoid being around people who are smoking (secondhand smoke).  Keep all follow-up visits as told by your health care provider. This is important. Contact a health care provider if:  You have a fever.  Your symptoms get worse.  Your  symptoms do not improve within 10 days. Get help right away if:  You have a severe headache.  You have persistent vomiting.  You have pain or swelling around your face or eyes.  You have vision problems.  You develop confusion.  Your neck is stiff.  You have trouble breathing. This information is not intended to replace advice given to you by your health care provider. Make sure you discuss any questions you have with your health care provider. Document Released: 02/17/2005 Document Revised: 10/14/2015 Document Reviewed: 12/13/2014 Elsevier Interactive Patient Education  2017 [employer].

## 2016-06-13 ENCOUNTER — Telehealth: Payer: Self-pay | Admitting: Nurse Practitioner

## 2016-06-13 NOTE — Telephone Encounter (Signed)
Faxed [facility] to Chriss Czar MD

## 2016-07-07 ENCOUNTER — Telehealth: Payer: Self-pay | Admitting: Nurse Practitioner

## 2016-07-07 NOTE — Telephone Encounter (Signed)
Attempted to call patient regarding awv. Patient did answer. Will try to call patient at a later time.

## 2016-07-11 ENCOUNTER — Encounter: Payer: Self-pay | Admitting: Nurse Practitioner

## 2016-07-14 MED ORDER — BUTALBITAL-ACETAMINOPHEN 50-300 MG PO TABS: 1.0000 | ORAL_TABLET | Freq: Three times a day (TID) | ORAL | 0 refills | Status: DC | PRN

## 2016-07-23 ENCOUNTER — Other Ambulatory Visit: Payer: Self-pay | Admitting: Nurse Practitioner

## 2016-07-23 NOTE — Telephone Encounter (Signed)
Routing to charlotte, please advise, thanks 

## 2016-09-10 ENCOUNTER — Ambulatory Visit (HOSPITAL_COMMUNITY)
Admission: RE | Admit: 2016-09-10 | Discharge: 2016-09-10 | Disposition: A | Discharge status: Home / Self Care | Payer: Medicare HMO | Source: Ambulatory Visit | Attending: Cardiology | Admitting: Cardiology

## 2016-09-10 ENCOUNTER — Ambulatory Visit (INDEPENDENT_AMBULATORY_CARE_PROVIDER_SITE_OTHER): Payer: Medicare HMO | Admitting: Nurse Practitioner

## 2016-09-10 ENCOUNTER — Encounter: Payer: Self-pay | Admitting: Nurse Practitioner

## 2016-09-10 VITALS — BP 122/70 | HR 75 | Temp 98.3°F | Ht 67.0 in | Wt 184.0 lb

## 2016-09-10 DIAGNOSIS — R51 Headache: Secondary | ICD-10-CM

## 2016-09-10 DIAGNOSIS — M79661 Pain in right lower leg: Secondary | ICD-10-CM | POA: Diagnosis not present

## 2016-09-10 DIAGNOSIS — M7989 Other specified soft tissue disorders: Secondary | ICD-10-CM

## 2016-09-10 DIAGNOSIS — R519 Headache, unspecified: Secondary | ICD-10-CM

## 2016-09-10 MED ORDER — BUTALBITAL-ACETAMINOPHEN 50-300 MG PO TABS: 1.0000 | ORAL_TABLET | Freq: Three times a day (TID) | ORAL | 0 refills | Status: DC | PRN

## 2016-09-10 NOTE — Patient Instructions (Addendum)
Go to [address] for venous doppler to rule out DVT.  Venous doppler negative for DVT.  Use knee high compression stocking during the day and off at night.

## 2016-09-10 NOTE — Progress Notes (Signed)
Subjective:  Patient ID: Anne Palmer, female    DOB: 02/01/1956  Age: 61 y.o. MRN: [id number]  CC: Leg Swelling (legs swelling 1 wk/)   Leg Pain   The incident occurred 5 to 7 days ago. There was no injury mechanism. The pain is present in the right leg. The quality of the pain is described as aching. The pain has been constant since onset. Pertinent negatives include no inability to bear weight, loss of motion, loss of sensation, muscle weakness, numbness or tingling. The symptoms are aggravated by movement and weight bearing. She has tried elevation for the symptoms. The treatment provided mild relief.  denies any recent travel or activity that involved prolonged immobilization.  Outpatient Medications Prior to Visit  Medication Sig Dispense Refill  . albuterol (PROVENTIL HFA;VENTOLIN HFA) 108 (90 Base) MCG/ACT inhaler Inhale 2 puffs into the lungs every 6 (six) hours as needed for wheezing or shortness of breath. 1 Inhaler 2  . dextromethorphan-guaiFENesin (MUCINEX DM) 30-600 MG 12hr tablet Take 1 tablet by mouth 2 (two) times daily as needed for cough. 14 tablet 0  . fluticasone (FLONASE) 50 MCG/ACT nasal spray Place 2 sprays into both nostrils daily. 16 g 6  . lisinopril-hydrochlorothiazide (PRINZIDE,ZESTORETIC) 20-12.5 MG tablet Take 1 tablet by mouth daily. 90 tablet 1  . oxymetazoline (AFRIN NASAL SPRAY) 0.05 % nasal spray Place 1 spray into both nostrils 2 (two) times daily. Use only for 3days, then stop 30 mL 0  . venlafaxine XR (EFFEXOR-XR) 37.5 MG 24 hr capsule Take 1 capsule (37.5 mg total) by mouth daily with breakfast. 90 capsule 1  . Butalbital-Acetaminophen 50-300 MG TABS TAKE 1 TABLET BY MOUTH 3 (THREE) TIMES DAILY AS NEEDED. (Patient not taking: Reported on 09/10/2016) 30 tablet 0   No facility-administered medications prior to visit.     ROS See HPI  Objective:  BP 122/70   Pulse 75   Temp 98.3 F (36.8 C)   Ht 5\' 7"  (1.702 m)   Wt 184 lb (83.5 kg)   SpO2 98%    BMI 28.82 kg/m   BP Readings from Last 3 Encounters:  09/10/16 122/70  06/05/16 120/72  03/07/16 122/76    Wt Readings from Last 3 Encounters:  09/10/16 184 lb (83.5 kg)  06/05/16 183 lb (83 kg)  03/07/16 183 lb (83 kg)    Physical Exam  Constitutional: She is oriented to person, place, and time. No distress.  Neck: No JVD present.  Cardiovascular: Normal rate and regular rhythm.   Pulmonary/Chest: Effort normal and breath sounds normal.  Musculoskeletal: She exhibits edema and tenderness.  Neurological: She is alert and oriented to person, place, and time.  Skin: Skin is warm and dry. No rash noted. No erythema.  Vitals reviewed.   Lab Results  Component Value Date   WBC 10.1 03/07/2016   HGB 13.9 03/07/2016   HCT 40.8 03/07/2016   PLT 238.0 03/07/2016   GLUCOSE 100 (H) 03/07/2016   CHOL 189 03/07/2016   TRIG 144.0 03/07/2016   HDL 47.50 03/07/2016   LDLCALC 113 (H) 03/07/2016   ALT 8 03/07/2016   AST 9 03/07/2016   NA 142 03/07/2016   K 4.0 03/07/2016   CL 106 03/07/2016   CREATININE 0.63 03/07/2016   BUN 12 03/07/2016   CO2 28 03/07/2016   TSH 1.19 03/07/2016   HGBA1C 5.7 03/07/2016    Mm Digital Screening  Result Date: 06/02/2016 CLINICAL DATA:  Screening. EXAM: DIGITAL SCREENING BILATERAL MAMMOGRAM WITH CAD  COMPARISON:  Previous exam(s). ACR Breast Density Category b: There are scattered areas of fibroglandular density. FINDINGS: There are no findings suspicious for malignancy. Images were processed with CAD. IMPRESSION: No mammographic evidence of malignancy. A result letter of this screening mammogram will be mailed directly to the patient. RECOMMENDATION: Screening mammogram in one year. (Code:SM-B-01Y) BI-RADS CATEGORY  1: Negative. Electronically Signed   By: Amie Portlandavid  Ormond M.D.   On: 06/02/2016 15:41    Assessment & Plan:   Anne Palmer was seen today for leg swelling.  Diagnoses and all orders for this visit:  Pain and swelling of right lower  leg -     VAS US LOWER EXTREMITY VENOUS (DVT); Future  Headache around the eyes -     Butalbital-Acetaminophen 50-300 MG TABS; Take 1 tablet by mouth 3 (three) times daily as needed.   I am having Anne Palmer maintain her fluticasone, dextromethorphan-guaiFENesin, oxymetazoline, albuterol, lisinopril-hydrochlorothiazide, venlafaxine XR, and Butalbital-Acetaminophen.  Meds ordered this encounter  Medications  . Butalbital-Acetaminophen 50-300 MG TABS    Sig: Take 1 tablet by mouth 3 (three) times daily as needed.    Dispense:  30 tablet    Refill:  0    Order Specific Question:   Supervising Provider    Answer:   Tresa GarterPLOTNIKOV, ALEKSEI V [[id number]]     Follow-up: Return if symptoms worsen or fail to improve.  Alysia Pennaharlotte Joslyn Ramos, NP

## 2016-09-11 ENCOUNTER — Other Ambulatory Visit: Payer: Self-pay | Admitting: Nurse Practitioner

## 2016-09-11 ENCOUNTER — Telehealth: Payer: Self-pay | Admitting: Nurse Practitioner

## 2016-09-11 DIAGNOSIS — R51 Headache: Principal | ICD-10-CM

## 2016-09-11 DIAGNOSIS — R519 Headache, unspecified: Secondary | ICD-10-CM

## 2016-09-11 DIAGNOSIS — R600 Localized edema: Secondary | ICD-10-CM

## 2016-09-11 NOTE — Telephone Encounter (Signed)
Please advise 

## 2016-09-11 NOTE — Telephone Encounter (Signed)
Patient would like to know if Anne Palmer is going to call a script to her pharmacy for the fluid she is retaining in her legs?

## 2016-09-11 NOTE — Telephone Encounter (Signed)
Do not change prescription

## 2016-09-11 NOTE — Telephone Encounter (Signed)
Please advise. We send this rx in yesterday but pharmacy wants to know if we can send in 25mg ,50mg  or 100mg ?

## 2016-09-12 NOTE — Telephone Encounter (Signed)
Sorry, discard last phone note.   [facility] [employer] Rep. She said that insurance is not covering for Butalbital. She was wondering if charlotte wants to change it to Sumatriptan, Naratriptan. Please advise

## 2016-09-15 MED ORDER — SUMATRIPTAN SUCCINATE 25 MG PO TABS: 25.0000 mg | ORAL_TABLET | ORAL | 0 refills | Status: DC | PRN

## 2016-09-15 MED ORDER — FUROSEMIDE 20 MG PO TABS: 20.0000 mg | ORAL_TABLET | Freq: Every day | ORAL | 0 refills | Status: DC

## 2016-09-15 MED ORDER — POTASSIUM CHLORIDE CRYS ER 20 MEQ PO TBCR: 20.0000 meq | EXTENDED_RELEASE_TABLET | Freq: Every day | ORAL | 0 refills | Status: DC

## 2016-09-15 NOTE — Telephone Encounter (Signed)
rx for Imitrex faxed to [employer]--[facility]. Need to inform pt this.

## 2016-09-15 NOTE — Telephone Encounter (Signed)
Pt is aware.  

## 2016-12-09 ENCOUNTER — Ambulatory Visit (INDEPENDENT_AMBULATORY_CARE_PROVIDER_SITE_OTHER): Payer: Medicare HMO | Admitting: Nurse Practitioner

## 2016-12-09 ENCOUNTER — Encounter: Payer: Self-pay | Admitting: Nurse Practitioner

## 2016-12-09 ENCOUNTER — Other Ambulatory Visit (INDEPENDENT_AMBULATORY_CARE_PROVIDER_SITE_OTHER): Payer: Medicare HMO

## 2016-12-09 ENCOUNTER — Telehealth: Payer: Self-pay | Admitting: Nurse Practitioner

## 2016-12-09 ENCOUNTER — Ambulatory Visit (INDEPENDENT_AMBULATORY_CARE_PROVIDER_SITE_OTHER)
Admission: RE | Admit: 2016-12-09 | Discharge: 2016-12-09 | Disposition: A | Discharge status: Home / Self Care | Payer: Medicare HMO | Source: Ambulatory Visit | Attending: Nurse Practitioner | Admitting: Nurse Practitioner

## 2016-12-09 VITALS — BP 118/68 | HR 72 | Temp 98.2°F | Ht 67.0 in | Wt 180.0 lb

## 2016-12-09 DIAGNOSIS — Z780 Asymptomatic menopausal state: Secondary | ICD-10-CM

## 2016-12-09 DIAGNOSIS — E785 Hyperlipidemia, unspecified: Secondary | ICD-10-CM

## 2016-12-09 DIAGNOSIS — R739 Hyperglycemia, unspecified: Secondary | ICD-10-CM | POA: Diagnosis not present

## 2016-12-09 DIAGNOSIS — I10 Essential (primary) hypertension: Secondary | ICD-10-CM

## 2016-12-09 DIAGNOSIS — M8588 Other specified disorders of bone density and structure, other site: Secondary | ICD-10-CM | POA: Diagnosis not present

## 2016-12-09 DIAGNOSIS — G8929 Other chronic pain: Secondary | ICD-10-CM | POA: Diagnosis not present

## 2016-12-09 DIAGNOSIS — J209 Acute bronchitis, unspecified: Secondary | ICD-10-CM | POA: Diagnosis not present

## 2016-12-09 DIAGNOSIS — M8580 Other specified disorders of bone density and structure, unspecified site: Secondary | ICD-10-CM | POA: Insufficient documentation

## 2016-12-09 DIAGNOSIS — J324 Chronic pansinusitis: Secondary | ICD-10-CM

## 2016-12-09 DIAGNOSIS — F17200 Nicotine dependence, unspecified, uncomplicated: Secondary | ICD-10-CM | POA: Diagnosis not present

## 2016-12-09 DIAGNOSIS — R51 Headache: Secondary | ICD-10-CM

## 2016-12-09 DIAGNOSIS — M549 Dorsalgia, unspecified: Secondary | ICD-10-CM | POA: Diagnosis not present

## 2016-12-09 DIAGNOSIS — R519 Headache, unspecified: Secondary | ICD-10-CM

## 2016-12-09 LAB — BASIC METABOLIC PANEL
BUN: 10 mg/dL (ref 6–23)
CALCIUM: 9.2 mg/dL (ref 8.4–10.5)
CO2: 28 mEq/L (ref 19–32)
CREATININE: 0.67 mg/dL (ref 0.40–1.20)
Chloride: 106 mEq/L (ref 96–112)
GFR: 115.33 mL/min (ref >60.00)
Glucose, Bld: 110 mg/dL — ABNORMAL HIGH (ref 70–99)
Potassium: 4 mEq/L (ref 3.5–5.1)
Sodium: 142 mEq/L (ref 135–145)

## 2016-12-09 LAB — HEPATIC FUNCTION PANEL
ALT: 7 U/L (ref 0–35)
AST: 6 U/L (ref 0–37)
Albumin: 3.7 g/dL (ref 3.5–5.2)
Alkaline Phosphatase: 65 U/L (ref 39–117)
BILIRUBIN DIRECT: 0.1 mg/dL (ref 0.0–0.3)
BILIRUBIN TOTAL: 0.6 mg/dL (ref 0.2–1.2)
Total Protein: 6.2 g/dL (ref 6.0–8.3)

## 2016-12-09 LAB — LIPID PANEL
CHOL/HDL RATIO: 4
CHOLESTEROL: 166 mg/dL (ref 0–200)
HDL: 38 mg/dL — AB (ref >39.00)
LDL CALC: 98 mg/dL (ref 0–99)
NonHDL: 127.59
Triglycerides: 149 mg/dL (ref 0.0–149.0)
VLDL: 29.8 mg/dL (ref 0.0–40.0)

## 2016-12-09 MED ORDER — NAPROXEN 500 MG PO TABS
500.0000 mg | ORAL_TABLET | Freq: Two times a day (BID) | ORAL | 1 refills | Status: DC | PRN
Start: 2016-12-09 — End: 2017-02-15

## 2016-12-09 MED ORDER — VENLAFAXINE HCL ER 37.5 MG PO CP24: 37.5000 mg | ORAL_CAPSULE | Freq: Every day | ORAL | 1 refills | Status: DC

## 2016-12-09 MED ORDER — ATORVASTATIN CALCIUM 40 MG PO TABS: 40.0000 mg | ORAL_TABLET | Freq: Every day | ORAL | 1 refills | Status: DC

## 2016-12-09 MED ORDER — LISINOPRIL-HYDROCHLOROTHIAZIDE 20-12.5 MG PO TABS: 1.0000 | ORAL_TABLET | Freq: Every day | ORAL | 1 refills | Status: DC

## 2016-12-09 MED ORDER — VITAMIN D 1000 UNITS PO TABS: 1000.0000 [IU] | ORAL_TABLET | Freq: Two times a day (BID) | ORAL | Status: DC

## 2016-12-09 MED ORDER — LEVOFLOXACIN 500 MG PO TABS: 500.0000 mg | ORAL_TABLET | Freq: Every day | ORAL | 0 refills | Status: DC

## 2016-12-09 MED ORDER — BUTALBITAL-ACETAMINOPHEN 50-300 MG PO TABS: 1.0000 | ORAL_TABLET | Freq: Three times a day (TID) | ORAL | 1 refills | Status: DC | PRN

## 2016-12-09 MED ORDER — DM-GUAIFENESIN ER 30-600 MG PO TB12: 1.0000 | ORAL_TABLET | Freq: Two times a day (BID) | ORAL | 0 refills | Status: DC | PRN

## 2016-12-09 MED ORDER — CALCIUM CARBONATE 600 MG PO TABS: 600.0000 mg | ORAL_TABLET | Freq: Two times a day (BID) | ORAL | Status: AC

## 2016-12-09 MED ORDER — FLUTICASONE PROPIONATE 50 MCG/ACT NA SUSP: 2.0000 | Freq: Every day | NASAL | 6 refills | Status: DC

## 2016-12-09 NOTE — Telephone Encounter (Signed)
rx resend

## 2016-12-09 NOTE — Assessment & Plan Note (Signed)
X-ray also indicates arteriolosclerosis (plaque in blood vessels) which puts you at risk for cardiovascular or cerebrovascular event. There I will suggest starting lipid lowering agent even though lipid panel is normal. I also strongly recommend that you stop tobacco use. Lipid Panel     Component Value Date/Time   CHOL 166 12/09/2016 1112   TRIG 149.0 12/09/2016 1112   HDL 38.00 (L) 12/09/2016 1112   CHOLHDL 4 12/09/2016 1112   VLDL 29.8 12/09/2016 1112   LDLCALC 98 12/09/2016 1112

## 2016-12-09 NOTE — Assessment & Plan Note (Signed)
Controlled with lisinopril/HCTZ. BP Readings from Last 3 Encounters:  12/09/16 118/68  09/10/16 122/70  06/05/16 120/72

## 2016-12-09 NOTE — Addendum Note (Signed)
Addended byLivingston Diones on: 12/09/2016 05:30 PM   Modules accepted: Orders

## 2016-12-09 NOTE — Telephone Encounter (Signed)
Pt returned your call regarding her labwork. I gave her Charlotte's response. She expressed understanding and did not have any questions.

## 2016-12-09 NOTE — Patient Instructions (Addendum)
Go to basement for blood draw and x-ray. You will be called with results. Will send medication refill after review of lab results.  Return to office in 1week for influenza vaccine.  Continue use of compression stocking and low salt diet for LE edema.  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 Also indicates osteopenia: start calcium  BID and vitamin D 1000IU BID. Rx for naprosyn BID prn sent. If no improvement with this, I will recommend CT thoracic spine.  Stable lab results. Continue medications as prescribed. Refills sent. X-ray also indicates arteriolosclerosis (plaque in blood vessels) which puts you at risk for cardiovascular or cerebrovascular event. There I will suggest starting lipid lowering agent even though lipid panel is normal. I also strongly recommend that you stop tobacco use.

## 2016-12-09 NOTE — Assessment & Plan Note (Signed)
Chronic upper back pain (onging for several years per patient), worse with sitting upright, improves with laying down and leaning forward or walking. DG done today Start naproxen prn. Consider CT thoracic spine and/or PT if no improvement?

## 2016-12-09 NOTE — Progress Notes (Signed)
Subjective:  Patient ID: Anne Palmer Reasons, female    DOB: [birth year]  Age: 61 y.o. MRN: [id number]  CC: Follow-up (6 mo fu/ med consult--not coming to [place]/ flu shot? fasting/ question about hyperglycemia on problem list?); Leg Pain (right leg swelling still); and Back Pain (back pain when stay still. )   Back Pain  This is a chronic problem. The current episode started more than 1 year ago. The problem occurs intermittently. The problem has been waxing and waning since onset. The pain is present in the thoracic spine. The quality of the pain is described as aching and stabbing. The pain does not radiate. The pain is the same all the time. The symptoms are aggravated by sitting. Stiffness is present all day. Pertinent negatives include no abdominal pain, bladder incontinence, bowel incontinence, chest pain, dysuria, fever, headaches, leg pain, numbness, paresis, paresthesias, pelvic pain, perianal numbness, tingling, weakness or weight loss. (Worse with standing or sitting upright) Risk factors include poor posture and menopause. She has tried bed rest for the symptoms. The treatment provided mild relief.  Cough  This is a new problem. The current episode started in the past 7 days. The problem has been gradually worsening. The problem occurs constantly. The cough is productive of purulent sputum. Associated symptoms include chills, nasal congestion, postnasal drip, rhinorrhea and shortness of breath. Pertinent negatives include no chest pain, fever, headaches or weight loss. The symptoms are aggravated by lying down. Risk factors for lung disease include smoking/tobacco exposure. She has tried OTC cough suppressant for the symptoms. The treatment provided mild relief.  no pain with her daily exercise regimen (walking for 30-28mins a day). Has to sit while cooking or any ADLs that requires standing for prolong periods of time.  LE edema: Intermittent, worse with prolonged standing and walking.  improves with temporary lasix use, compression stocking and elevation. Has also made changes to diet (low sodium)  HTN: Controlled with lisinopril/HCTZ. Persistent tobacco use (daily) BP Readings from Last 3 Encounters:  12/09/16 118/68  09/10/16 122/70  06/05/16 120/72   Outpatient Medications Prior to Visit  Medication Sig Dispense Refill  . albuterol (PROVENTIL HFA;VENTOLIN HFA) 108 (90 Base) MCG/ACT inhaler Inhale 2 puffs into the lungs every 6 (six) hours as needed for wheezing or shortness of breath. 1 Inhaler 2  . fluticasone (FLONASE) 50 MCG/ACT nasal spray Place 2 sprays into both nostrils daily. 16 g 6  . furosemide (LASIX) 20 MG tablet Take 1 tablet (20 mg total) by mouth daily. 3 tablet 0  . potassium chloride SA (K-DUR,KLOR-CON) 20 MEQ tablet Take 1 tablet (20 mEq total) by mouth daily. 3 tablet 0  . Butalbital-Acetaminophen 50-300 MG TABS Take 1 tablet by mouth 3 (three) times daily as needed. 30 tablet 0  . dextromethorphan-guaiFENesin (MUCINEX DM) 30-600 MG 12hr tablet Take 1 tablet by mouth 2 (two) times daily as needed for cough. 14 tablet 0  . lisinopril-hydrochlorothiazide (PRINZIDE,ZESTORETIC) 20-12.5 MG tablet Take 1 tablet by mouth daily. 90 tablet 1  . oxymetazoline (AFRIN NASAL SPRAY) 0.05 % nasal spray Place 1 spray into both nostrils 2 (two) times daily. Use only for 3days, then stop 30 mL 0  . venlafaxine XR (EFFEXOR-XR) 37.5 MG 24 hr capsule Take 1 capsule (37.5 mg total) by mouth daily with breakfast. 90 capsule 1  . SUMAtriptan (IMITREX) 25 MG tablet Take 1 tablet (25 mg total) by mouth every 2 (two) hours as needed for migraine. May repeat in 2 hours if headache persists  or recurs. Do not take more than 4 tabs in 24hrs period. (Patient not taking: Reported on 12/09/2016) 10 tablet 0   No facility-administered medications prior to visit.     ROS See HPI  Objective:  BP 118/68   Pulse 72   Temp 98.2 F (36.8 C)   Ht  (1.702 m)   Wt 180 lb (81.6  kg)   SpO2 98%   BMI 28.19 kg/m   BP Readings from Last 3 Encounters:  12/09/16 118/68  09/10/16 122/70  06/05/16 120/72    Wt Readings from Last 3 Encounters:  12/09/16 180 lb (81.6 kg)  09/10/16 184 lb (83.5 kg)  06/05/16 183 lb (83 kg)    Physical Exam  Constitutional: She is oriented to person, place, and time.  HENT:  Right Ear: Tympanic membrane, external ear and ear canal normal.  Left Ear: Tympanic membrane, external ear and ear canal normal.  Nose: Mucosal edema and rhinorrhea present. Right sinus exhibits maxillary sinus tenderness. Right sinus exhibits no frontal sinus tenderness. Left sinus exhibits maxillary sinus tenderness. Left sinus exhibits no frontal sinus tenderness.  Mouth/Throat: Uvula is midline. No trismus in the jaw. Posterior oropharyngeal erythema present. No oropharyngeal exudate.  Eyes: No scleral icterus.  Neck: Normal range of motion. Neck supple.  Cardiovascular: Normal rate, regular rhythm, normal heart sounds and intact distal pulses.   Pulses:      Dorsalis pedis pulses are 2+ on the right side, and 2+ on the left side.       Posterior tibial pulses are 2+ on the right side, and 2+ on the left side.  Pulmonary/Chest: Effort normal. She has wheezes.  Musculoskeletal: Normal range of motion. She exhibits no edema, tenderness or deformity.       Cervical back: Normal.       Thoracic back: Normal.  Lymphadenopathy:    She has no cervical adenopathy.  Neurological: She is alert and oriented to person, place, and time.  Vitals reviewed.   Lab Results  Component Value Date   WBC 10.1 03/07/2016   HGB 13.9 03/07/2016   HCT 40.8 03/07/2016   PLT 238.0 03/07/2016   GLUCOSE 110 (H) 12/09/2016   CHOL 166 12/09/2016   TRIG 149.0 12/09/2016   HDL 38.00 (L) 12/09/2016   LDLCALC 98 12/09/2016   ALT 7 12/09/2016   AST 6 12/09/2016   NA 142 12/09/2016   K 4.0 12/09/2016   CL 106 12/09/2016   CREATININE 0.67 12/09/2016   BUN 10 12/09/2016    CO2 28 12/09/2016   TSH 1.19 03/07/2016   HGBA1C 5.7 03/07/2016    No results found.  Assessment & Plan:   Sabella was seen today for follow-up, leg pain and back pain.  Diagnoses and all orders for this visit:  Essential hypertension -     Basic metabolic panel; Future -     lisinopril-hydrochlorothiazide (PRINZIDE,ZESTORETIC) 20-12.5 MG tablet; Take 1 tablet by mouth daily.  Headache around the eyes -     Butalbital-Acetaminophen 50-300 MG TABS; Take 1 tablet by mouth 3 (three) times daily as needed.  Tobacco use disorder  Acute bronchitis, unspecified organism -     dextromethorphan-guaiFENesin (MUCINEX DM) 30-600 MG 12hr tablet; Take 1 tablet by mouth 2 (two) times daily as needed for cough. -     levofloxacin (LEVAQUIN) 500 MG tablet; Take 1 tablet (500 mg total) by mouth daily.  Upper back pain, chronic -     DG Thoracic Spine W/Swimmers; Future -  naproxen (NAPROSYN) 500 MG tablet; Take 1 tablet (500 mg total) by mouth 2 (two) times daily between meals as needed.  Hyperlipidemia, unspecified hyperlipidemia type -     Hepatic function panel; Future -     Lipid panel; Future -     atorvastatin (LIPITOR) 40 MG tablet; Take 1 tablet (40 mg total) by mouth daily at 6 PM.  Osteopenia of spine -     calcium carbonate (CALCIUM 600) 600 MG TABS tablet; Take 1 tablet (600 mg total) by mouth 2 (two) times daily with a meal. -     cholecalciferol (VITAMIN D) 1000 units tablet; Take 1 tablet (1,000 Units total) by mouth 2 (two) times daily.  Postmenopausal state -     venlafaxine XR (EFFEXOR-XR) 37.5 MG 24 hr capsule; Take 1 capsule (37.5 mg total) by mouth daily with breakfast.  Hyperglycemia   I have discontinued Ms. Norfolk's dextromethorphan-guaiFENesin, oxymetazoline, and SUMAtriptan. I am also having her start on dextromethorphan-guaiFENesin, levofloxacin, naproxen, calcium carbonate, cholecalciferol, and atorvastatin. Additionally, I am having her maintain her  fluticasone, albuterol, furosemide, potassium chloride SA, Butalbital-Acetaminophen, lisinopril-hydrochlorothiazide, and venlafaxine XR.  Meds ordered this encounter  Medications  . Butalbital-Acetaminophen 50-300 MG TABS    Sig: Take 1 tablet by mouth 3 (three) times daily as needed.    Dispense:  30 tablet    Refill:  1    Order Specific Question:   Supervising Provider    Answer:   Tresa Garter [[id number]]  . dextromethorphan-guaiFENesin (MUCINEX DM) 30-600 MG 12hr tablet    Sig: Take 1 tablet by mouth 2 (two) times daily as needed for cough.    Dispense:  14 tablet    Refill:  0    Order Specific Question:   Supervising Provider    Answer:   Tresa Garter [[id number]]  . levofloxacin (LEVAQUIN) 500 MG tablet    Sig: Take 1 tablet (500 mg total) by mouth daily.    Dispense:  5 tablet    Refill:  0    Order Specific Question:   Supervising Provider    Answer:   Tresa Garter [[id number]]  . naproxen (NAPROSYN) 500 MG tablet    Sig: Take 1 tablet (500 mg total) by mouth 2 (two) times daily between meals as needed.    Dispense:  60 tablet    Refill:  1    Order Specific Question:   Supervising Provider    Answer:   Tresa Garter [[id number]]  . calcium carbonate (CALCIUM 600) 600 MG TABS tablet    Sig: Take 1 tablet (600 mg total) by mouth 2 (two) times daily with a meal.    Dispense:  60 tablet    Order Specific Question:   Supervising Provider    Answer:   Tresa Garter [[id number]]  . cholecalciferol (VITAMIN D) 1000 units tablet    Sig: Take 1 tablet (1,000 Units total) by mouth 2 (two) times daily.    Order Specific Question:   Supervising Provider    Answer:   Tresa Garter [[id number]]  . atorvastatin (LIPITOR) 40 MG tablet    Sig: Take 1 tablet (40 mg total) by mouth daily at 6 PM.    Dispense:  90 tablet    Refill:  1    Order Specific Question:   Supervising Provider    Answer:   Tresa Garter [[id number]]  . lisinopril-hydrochlorothiazide  (PRINZIDE,ZESTORETIC) 20-12.5 MG tablet    Sig: Take 1 tablet by mouth  daily.    Dispense:  90 tablet    Refill:  1    Order Specific Question:   Supervising Provider    Answer:   Tresa Garter [[id number]]  . venlafaxine XR (EFFEXOR-XR) 37.5 MG 24 hr capsule    Sig: Take 1 capsule (37.5 mg total) by mouth daily with breakfast.    Dispense:  90 capsule    Refill:  1    Order Specific Question:   Supervising Provider    Answer:   Tresa Garter [[id number]]    Follow-up: Return in about 6 months (around 06/09/2017) for with new NP.  Alysia Penna, NP

## 2016-12-09 NOTE — Telephone Encounter (Signed)
Lisionpril, Venlafxine, Fluticasone should be sent to [facility].

## 2016-12-09 NOTE — Assessment & Plan Note (Signed)
hgbA1c 5.7

## 2017-02-15 ENCOUNTER — Encounter (HOSPITAL_COMMUNITY): Payer: Self-pay

## 2017-02-15 ENCOUNTER — Ambulatory Visit (HOSPITAL_COMMUNITY)
Admission: EM | Admit: 2017-02-15 | Discharge: 2017-02-15 | Disposition: A | Discharge status: Home / Self Care | Payer: Medicare HMO | Attending: Internal Medicine | Admitting: Internal Medicine

## 2017-02-15 ENCOUNTER — Other Ambulatory Visit: Payer: Self-pay

## 2017-02-15 DIAGNOSIS — R109 Unspecified abdominal pain: Secondary | ICD-10-CM

## 2017-02-15 LAB — POCT I-STAT, CHEM 8
BUN: 5 mg/dL — AB (ref 6–20)
CALCIUM ION: 1.15 mmol/L (ref 1.15–1.40)
CREATININE: 0.5 mg/dL (ref 0.44–1.00)
Chloride: 106 mmol/L (ref 101–111)
Glucose, Bld: 100 mg/dL — ABNORMAL HIGH (ref 65–99)
HCT: 41 % (ref 36.0–46.0)
HEMOGLOBIN: 13.9 g/dL (ref 12.0–15.0)
Potassium: 4.2 mmol/L (ref 3.5–5.1)
SODIUM: 144 mmol/L (ref 135–145)
TCO2: 27 mmol/L (ref 22–32)

## 2017-02-15 LAB — POCT URINALYSIS DIP (DEVICE)
Bilirubin Urine: NEGATIVE
Glucose, UA: NEGATIVE mg/dL
Hgb urine dipstick: NEGATIVE
KETONES UR: NEGATIVE mg/dL
Leukocytes, UA: NEGATIVE
Nitrite: NEGATIVE
PH: 8.5 — AB (ref 5.0–8.0)
PROTEIN: NEGATIVE mg/dL
Specific Gravity, Urine: 1.015 (ref 1.005–1.030)
UROBILINOGEN UA: 0.2 mg/dL (ref 0.0–1.0)

## 2017-02-15 MED ORDER — NAPROXEN 500 MG PO TABS: 500.0000 mg | ORAL_TABLET | Freq: Two times a day (BID) | ORAL | 0 refills | Status: DC

## 2017-02-15 MED ORDER — KETOROLAC TROMETHAMINE 30 MG/ML IJ SOLN
INTRAMUSCULAR | Status: AC
  Filled 2017-02-15: qty 1

## 2017-02-15 MED ORDER — KETOROLAC TROMETHAMINE 30 MG/ML IJ SOLN
30.0000 mg | Freq: Once | INTRAMUSCULAR | Status: AC
  Administered 2017-02-15: 30 mg via INTRAMUSCULAR

## 2017-02-15 NOTE — Discharge Instructions (Signed)
Drink plenty of fluids to ensure adequate hydration. Your kidney function and urine test are normal tonight. Your pain is reproduced with pushing on the area affected, therefore it is more likely caused by muscular irritation. I recommended naproxen, twice a day, for pain, take with food. Like regular activity. If symptoms worsen, increased pain, vomiting, fevers develop return to clinic or Ed, or if do not improve in the next week to follow up with PCP.

## 2017-02-15 NOTE — ED Triage Notes (Signed)
Pt presents today with left sided flank pain. She thought it was gas and took some baking soda to go to the bathroom and she was able to have a BM but the pain is still there. Has also tried OTC gas relief with no relief. States she is having no urinary sxs or other sxs associated with the pain.

## 2017-02-15 NOTE — ED Provider Notes (Signed)
[facility]    CSN: [id number] Arrival date & time: 02/15/17  1630     History   Chief Complaint Chief Complaint  Patient presents with  . Flank Pain    HPI Anne Palmer is a 61 y.o. female.   Anne Palmer presents with complaints of left flank pain which started last evening. She had been at a baby shower prior to pain starting. Walking does seem to help with the pain. Denies previous similarly intense, but states had an xray of her back due to pain in same location. Without blood to urine, urinary frequency, urgency or pain with urination. Denies nausea or vomiting. States she feels like it is gas pain, took mylanta and has since had bowel movements which have become lose even, has not helped with pain. Pain does not radiate. Without history of kidney stones. Without fever, chest pain or shortness of breath. Has a history of diverticulitis, microscopic hematuria, hypertension. Without weakness, numbness or tingling to extremities. Rates pain 8/10.     ROS per HPI.       Past Medical History:  Diagnosis Date  . Arthritis   . Diverticulitis 2015  . Hematuria   . Hypertension     Patient Active Problem List   Diagnosis Date Noted  . Hyperlipidemia 12/09/2016  . Osteopenia 12/09/2016  . Upper back pain, chronic 12/09/2016  . Chronic sinusitis 06/05/2016  . HTN (hypertension) 03/07/2016  . Headache around the eyes 03/07/2016  . Bilateral chronic knee pain 03/07/2016  . Postmenopausal state 03/07/2016  . Hyperglycemia 03/07/2016  . Tobacco use disorder 03/07/2016    Past Surgical History:  Procedure Laterality Date  . ABDOMINAL HYSTERECTOMY     secondary to endometriosis and fibroids  . COLONOSCOPY  2015   no polyps, diverticulosis  . SHOULDER SURGERY  2008   left    OB History    No data available       Home Medications    Prior to Admission medications   Medication Sig Start Date End Date Taking? Authorizing Provider  albuterol  (PROVENTIL HFA;VENTOLIN HFA) 108 (90 Base) MCG/ACT inhaler Inhale 2 puffs into the lungs every 6 (six) hours as needed for wheezing or shortness of breath. 06/05/16  Yes Nche, Bonna Gainsharlotte Lum, NP  atorvastatin (LIPITOR) 40 MG tablet Take 1 tablet (40 mg total) by mouth daily at 6 PM. 12/09/16  Yes Nche, Bonna Gainsharlotte Lum, NP  fluticasone (FLONASE) 50 MCG/ACT nasal spray Place 2 sprays into both nostrils daily. 12/09/16  Yes Nche, Bonna Gainsharlotte Lum, NP  lisinopril-hydrochlorothiazide (PRINZIDE,ZESTORETIC) 20-12.5 MG tablet Take 1 tablet by mouth daily. 12/09/16  Yes Nche, Bonna Gainsharlotte Lum, NP  venlafaxine XR (EFFEXOR-XR) 37.5 MG 24 hr capsule Take 1 capsule (37.5 mg total) by mouth daily with breakfast. 12/09/16  Yes Nche, Bonna Gainsharlotte Lum, NP  Butalbital-Acetaminophen 50-300 MG TABS Take 1 tablet by mouth 3 (three) times daily as needed. 12/09/16   Nche, Bonna Gainsharlotte Lum, NP  calcium carbonate (CALCIUM 600) 600 MG TABS tablet Take 1 tablet (600 mg total) by mouth 2 (two) times daily with a meal. 12/09/16   Nche, Bonna Gainsharlotte Lum, NP  cholecalciferol (VITAMIN D) 1000 units tablet Take 1 tablet (1,000 Units total) by mouth 2 (two) times daily. 12/09/16   Nche, Bonna Gainsharlotte Lum, NP  furosemide (LASIX) 20 MG tablet Take 1 tablet (20 mg total) by mouth daily. 09/15/16   Nche, Bonna Gainsharlotte Lum, NP  naproxen (NAPROSYN) 500 MG tablet Take 1 tablet (500 mg total) by mouth 2 (two) times  daily. 02/15/17   Linus MakoBurky, Natalie B, NP  potassium chloride SA (K-DUR,KLOR-CON) 20 MEQ tablet Take 1 tablet (20 mEq total) by mouth daily. 09/15/16   Nche, Bonna Gainsharlotte Lum, NP    Family History Family History  Problem Relation Age of Onset  . Arthritis Mother   . Stroke Mother   . Hypertension Mother   . Diabetes Mother   . Alcohol abuse Father   . Arthritis Sister   . Hyperlipidemia Sister   . Diabetes Sister   . Lupus Sister   . Arthritis Brother   . Diabetes Brother     Social History Social History   Tobacco Use  . Smoking status: Current Every Day  Smoker    Packs/day: 0.25  . Smokeless tobacco: Never Used  Substance Use Topics  . Alcohol use: Yes    Comment: occasional  . Drug use: No     Allergies   Sulfa antibiotics   Review of Systems Review of Systems   Physical Exam Triage Vital Signs ED Triage Vitals  Enc Vitals Group     BP 02/15/17 1703 (!) 142/71     Pulse Rate 02/15/17 1703 68     Resp 02/15/17 1703 16     Temp 02/15/17 1703 97.9 F (36.6 C)     Temp Source 02/15/17 1703 Oral     SpO2 02/15/17 1703 99 %     Weight --      Height --      Head Circumference --      Peak Flow --      Pain Score 02/15/17 1705 6     Pain Loc --      Pain Edu? --      Excl. in GC? --    No data found.  Updated Vital Signs BP (!) 142/71 (BP Location: Left Arm)   Pulse 68   Temp 97.9 F (36.6 C) (Oral)   Resp 16   SpO2 99%   Visual Acuity Right Eye Distance:   Left Eye Distance:   Bilateral Distance:    Right Eye Near:   Left Eye Near:    Bilateral Near:     Physical Exam  Constitutional: She is oriented to person, place, and time. She appears well-developed and well-nourished. No distress.  Cardiovascular: Normal rate, regular rhythm and normal heart sounds.  Pulmonary/Chest: Effort normal and breath sounds normal.  Abdominal: Normal appearance and bowel sounds are normal. There is tenderness. There is no CVA tenderness.    Left low back into flank pain, reproducible with palpation; reproducible with twist of torso  Musculoskeletal:       Arms: Neurological: She is alert and oriented to person, place, and time.  Skin: Skin is warm and dry.  Vitals reviewed.    UC Treatments / Results  Labs (all labs ordered are listed, but only abnormal results are displayed) Labs Reviewed  POCT URINALYSIS DIP (DEVICE) - Abnormal; Notable for the following components:      Result Value   pH 8.5 (*)    All other components within normal limits  POCT I-STAT, CHEM 8 - Abnormal; Notable for the following  components:   BUN 5 (*)    Glucose, Bld 100 (*)    All other components within normal limits    EKG  EKG Interpretation None       Radiology No results found.  Procedures Procedures (including critical care time)  Medications Ordered in UC Medications  ketorolac (TORADOL) 30 MG/ML injection  30 mg (30 mg Intramuscular Given 02/15/17 1735)     Initial Impression / Assessment and Plan / UC Course  I have reviewed the triage vital signs and the nursing notes.  Pertinent labs & imaging results that were available during my care of the patient were reviewed by me and considered in my medical decision making (see chart for details).     Without fever, tachycardia. Pain is reproducible with palpation. Without blood to urine, creatinine remains WNL at this time. Without abdominal pain, nausea or vomiting. More consistent with muscular strain rather than kidney in origin. Recommend naproxen twice a day, take with food. If symptoms worsen or do not improve in the next week to return to be seen or to follow up with PCP.  Patient verbalized understanding and agreeable to plan.    Final Clinical Impressions(s) / UC Diagnoses   Final diagnoses:  Flank pain    ED Discharge Orders        Ordered    naproxen (NAPROSYN) 500 MG tablet  2 times daily     02/15/17 1755       Controlled Substance Prescriptions [place] Controlled Substance Registry consulted? Not Applicable   Georgetta Haber, NP 02/15/17 1807

## 2017-02-17 ENCOUNTER — Ambulatory Visit: Payer: Self-pay | Admitting: *Deleted

## 2017-02-17 ENCOUNTER — Encounter (HOSPITAL_COMMUNITY): Payer: Self-pay | Admitting: Emergency Medicine

## 2017-02-17 DIAGNOSIS — R1032 Left lower quadrant pain: Secondary | ICD-10-CM | POA: Insufficient documentation

## 2017-02-17 DIAGNOSIS — E785 Hyperlipidemia, unspecified: Secondary | ICD-10-CM | POA: Insufficient documentation

## 2017-02-17 DIAGNOSIS — Z79899 Other long term (current) drug therapy: Secondary | ICD-10-CM | POA: Insufficient documentation

## 2017-02-17 DIAGNOSIS — I10 Essential (primary) hypertension: Secondary | ICD-10-CM | POA: Diagnosis not present

## 2017-02-17 DIAGNOSIS — F17200 Nicotine dependence, unspecified, uncomplicated: Secondary | ICD-10-CM | POA: Diagnosis not present

## 2017-02-17 NOTE — Telephone Encounter (Signed)
Pt calling saying her left side feels numb and feels like gas is moving around on that side. Pt states she has not had a BM since Sunday, but has been passing gas. Pt was seen at Urgent Care on 12/16 for the same complaints and it was ruled that the pt's flank pain is more consistent with muscle strain Pt had a prescription for Naproxen that she has not picked up yet. Pt states she has tried Tums and Miralax with no relief. Pt asked if taking prune juice would be effective in producing a bowel movement. Advised pt that she could take prune juice to see if that would help produce a bowel movement or she could also try a laxative like dulcolax over the counter. Pt verbalized understanding. Pt offered to make an appt for tomorrow to be further evaluated, but the pt states she would try home care first and call back tomorrow if she needs an appt.

## 2017-02-17 NOTE — ED Triage Notes (Signed)
Pt reports L flank pain since Saturday[facility]. Pt continues to have pain, gnawing pain 8/10

## 2017-02-18 ENCOUNTER — Emergency Department (HOSPITAL_COMMUNITY)
Admission: EM | Admit: 2017-02-18 | Discharge: 2017-02-18 | Disposition: A | Discharge status: Home / Self Care | Payer: Medicare HMO | Attending: Emergency Medicine | Admitting: Emergency Medicine

## 2017-02-18 DIAGNOSIS — R1032 Left lower quadrant pain: Secondary | ICD-10-CM

## 2017-02-18 MED ORDER — METRONIDAZOLE 500 MG PO TABS: 500.0000 mg | ORAL_TABLET | Freq: Three times a day (TID) | ORAL | 0 refills | Status: AC

## 2017-02-18 MED ORDER — CIPROFLOXACIN HCL 500 MG PO TABS
500.0000 mg | ORAL_TABLET | Freq: Two times a day (BID) | ORAL | 0 refills | Status: AC
Start: 2017-02-18 — End: 2017-02-25

## 2017-02-18 NOTE — ED Provider Notes (Signed)
[facility]ENC[facility] Provider Note   CSN: [id number] Arrival date & time: 02/17/17  2113     History   Chief Complaint Chief Complaint  Patient presents with  . Flank Pain    HPI Anne Palmer is a 61 y.o. female.  Patient is a 61 year old female with reported history of diverticulitis presenting with intermittent left lower quadrant pain worse with food that started 4 days ago.  She denies any nausea, vomiting, occasional loose stools and no fevers or chills.  Additionally denies any dysuria, hematuria, hematochezia or melena.  Patient rarely drinks alcohol.  Patient does have history of hysterectomy and oophorectomy.  Patient initially thought this was gas pain and started taking Mylanta which did not help with her discomfort.  She denies any chest pain, shortness of breath, numbness or tingling in her extremities.       Past Medical History:  Diagnosis Date  . Arthritis   . Diverticulitis 2015  . Hematuria   . Hypertension     Patient Active Problem List   Diagnosis Date Noted  . Hyperlipidemia 12/09/2016  . Osteopenia 12/09/2016  . Upper back pain, chronic 12/09/2016  . Chronic sinusitis 06/05/2016  . HTN (hypertension) 03/07/2016  . Headache around the eyes 03/07/2016  . Bilateral chronic knee pain 03/07/2016  . Postmenopausal state 03/07/2016  . Hyperglycemia 03/07/2016  . Tobacco use disorder 03/07/2016    Past Surgical History:  Procedure Laterality Date  . ABDOMINAL HYSTERECTOMY     secondary to endometriosis and fibroids  . COLONOSCOPY  2015   no polyps, diverticulosis  . SHOULDER SURGERY  2008   left    OB History    No data available       Home Medications    Prior to Admission medications   Medication Sig Start Date End Date Taking? Authorizing Provider  albuterol (PROVENTIL HFA;VENTOLIN HFA) 108 (90 Base) MCG/ACT inhaler Inhale 2 puffs into the lungs every 6 (six) hours as needed for wheezing or  shortness of breath. 06/05/16   Nche, Bonna Gainsharlotte Lum, NP  atorvastatin (LIPITOR) 40 MG tablet Take 1 tablet (40 mg total) by mouth daily at 6 PM. 12/09/16   Nche, Bonna Gainsharlotte Lum, NP  Butalbital-Acetaminophen 50-300 MG TABS Take 1 tablet by mouth 3 (three) times daily as needed. 12/09/16   Nche, Bonna Gainsharlotte Lum, NP  calcium carbonate (CALCIUM 600) 600 MG TABS tablet Take 1 tablet (600 mg total) by mouth 2 (two) times daily with a meal. 12/09/16   Nche, Bonna Gainsharlotte Lum, NP  cholecalciferol (VITAMIN D) 1000 units tablet Take 1 tablet (1,000 Units total) by mouth 2 (two) times daily. 12/09/16   Nche, Bonna Gainsharlotte Lum, NP  ciprofloxacin (CIPRO) 500 MG tablet Take 1 tablet (500 mg total) by mouth 2 (two) times daily for 7 days. 02/18/17 02/25/17  Renne MuscaWarden, Daniel L, MD  fluticasone (FLONASE) 50 MCG/ACT nasal spray Place 2 sprays into both nostrils daily. 12/09/16   Nche, Bonna Gainsharlotte Lum, NP  furosemide (LASIX) 20 MG tablet Take 1 tablet (20 mg total) by mouth daily. 09/15/16   Nche, Bonna Gainsharlotte Lum, NP  lisinopril-hydrochlorothiazide (PRINZIDE,ZESTORETIC) 20-12.5 MG tablet Take 1 tablet by mouth daily. 12/09/16   Nche, Bonna Gainsharlotte Lum, NP  metroNIDAZOLE (FLAGYL) 500 MG tablet Take 1 tablet (500 mg total) by mouth 3 (three) times daily for 7 days. 02/18/17 02/25/17  Renne MuscaWarden, Daniel L, MD  naproxen (NAPROSYN) 500 MG tablet Take 1 tablet (500 mg total) by mouth 2 (two) times daily. 02/15/17  Linus Mako B, NP  potassium chloride SA (K-DUR,KLOR-CON) 20 MEQ tablet Take 1 tablet (20 mEq total) by mouth daily. 09/15/16   Nche, Bonna Gains, NP  venlafaxine XR (EFFEXOR-XR) 37.5 MG 24 hr capsule Take 1 capsule (37.5 mg total) by mouth daily with breakfast. 12/09/16   Nche, Bonna Gains, NP    Family History Family History  Problem Relation Age of Onset  . Arthritis Mother   . Stroke Mother   . Hypertension Mother   . Diabetes Mother   . Alcohol abuse Father   . Arthritis Sister   . Hyperlipidemia Sister   . Diabetes Sister   .  Lupus Sister   . Arthritis Brother   . Diabetes Brother     Social History Social History   Tobacco Use  . Smoking status: Current Every Day Smoker    Packs/day: 0.25  . Smokeless tobacco: Never Used  Substance Use Topics  . Alcohol use: Yes    Comment: occasional  . Drug use: No     Allergies   Sulfa antibiotics   Review of Systems Review of Systems  Constitutional: Negative.   HENT: Negative.   Respiratory: Negative.   Cardiovascular: Negative.   Gastrointestinal: Positive for abdominal pain. Negative for blood in stool, constipation, diarrhea, nausea and vomiting.  Genitourinary: Negative.   Musculoskeletal: Negative.      Physical Exam Updated Vital Signs BP 122/84 (BP Location: Right Arm)   Pulse 87   Temp 98 F (36.7 C) (Oral)   Resp 16   Ht 5\' 7"  (1.702 m)   Wt 86.2 kg (190 lb)   SpO2 100%   BMI 29.76 kg/m   Physical Exam  Constitutional: She appears well-developed and well-nourished. No distress.  HENT:  Head: Normocephalic and atraumatic.  Eyes: Conjunctivae are normal.  Neck: Neck supple.  Cardiovascular: Normal rate and regular rhythm.  No murmur heard. Pulmonary/Chest: Effort normal and breath sounds normal. No respiratory distress.  Abdominal: Soft. She exhibits no distension and no mass. There is no tenderness.  Left lower quadrant tenderness, without rebounding or guarding.   Musculoskeletal: Normal range of motion. She exhibits no edema, tenderness or deformity.  Neurological: She is alert.  Skin: Skin is warm and dry.  Psychiatric: She has a normal mood and affect.  Nursing note and vitals reviewed.    ED Treatments / Results  Labs (all labs ordered are listed, but only abnormal results are displayed) Labs Reviewed  CBC WITH DIFFERENTIAL/PLATELET  COMPREHENSIVE METABOLIC PANEL  LIPASE, BLOOD  URINALYSIS, ROUTINE W REFLEX MICROSCOPIC    EKG  EKG Interpretation None       Radiology No results  found.  Procedures Procedures (including critical care time)  Medications Ordered in ED Medications - No data to display   Initial Impression / Assessment and Plan / ED Course  I have reviewed the triage vital signs and the nursing notes.  Pertinent labs & imaging results that were available during my care of the patient were reviewed by me and considered in my medical decision making (see chart for details).     Patient is a 61 year old female with history of diverticulitis presenting with 4 days of intermittent left lower quadrant pain worse with food who is nontoxic and has no associated fevers, chills, nausea, vomiting, dysuria, hematuria hematochezia or melena, but does have intermittent loose stools and history of constipation.  Patient was seen at urgent care on 02/15/2017 with similar symptoms and was discharged with course of Naprosyn for  presumed musculoskeletal pain.  At that time she had CBC was within normal limits and a UA that was within normal limits.  Patient has focal left lower quadrant pain without peritoneal signs.  Given history of diverticulitis and focal pain it would be reasonable to treat with ciprofloxacin and Flagyl for presumed uncomplicated diverticulitis.  Discussed return precautions with patient and recommended low fiber diet and follow-up with primary doctor as needed.   Final Clinical Impressions(s) / ED Diagnoses   Final diagnoses:  Left lower quadrant pain    ED Discharge Orders        Ordered    ciprofloxacin (CIPRO) 500 MG tablet  2 times daily     02/18/17 1024    metroNIDAZOLE (FLAGYL) 500 MG tablet  3 times daily     02/18/17 1024       Renne MuscaWarden, Daniel L, MD 02/18/17 1028    Mancel BaleWentz, Elliott, MD 02/19/17 [id number]

## 2017-02-18 NOTE — Discharge Instructions (Signed)
Anne Palmer you were seen today for ongoing abdominal pain.  Based on your story and history of diverticulitis, I believe this is what could be going on.  I think it is reasonable to try a course of antibiotics to see if this will help you.  Please take Flagyl and ciprofloxacin for the next 7 days as directed by instructions. We also discussed low fiber diet and I attached some instructions for that.  If you have worsening pain, develop fevers, chills, blood in your stool you should come back to the emergency department.  Otherwise can follow-up with your primary doctor as needed.

## 2017-02-18 NOTE — ED Notes (Signed)
Pt ambulates to room wiothout difficulty.

## 2017-02-18 NOTE — ED Notes (Addendum)
Per Aldean JewettMillie, RN verbal order from Dr. Myrtie SomanWarden to d/c blood work.

## 2017-02-18 NOTE — ED Notes (Signed)
Pt does not want any pain medications at this time.  States L flank is numb.  States pain is worse after she eats and she hasn't eaten since last night.

## 2017-02-19 NOTE — ED Provider Notes (Signed)
I saw and evaluated the patient, reviewed the resident's note and I agree with the findings and plan.   EKG Interpretation None       She has typical pain for her diverticulitis. No rectal bleeding. Mild food intolerance.  Nontoxic. Calm and comfortable. No respiratory distress.   Mancel BaleWentz, Jhair Witherington, MD 02/19/17 [phone]

## 2017-02-25 ENCOUNTER — Ambulatory Visit: Payer: Self-pay | Admitting: *Deleted

## 2017-02-25 NOTE — Telephone Encounter (Signed)
Pt reports minimal relief from left sided flank pain that she was evaluated for in UC on 02/14/17. Placed on antibiotics and Naproxen for pain. No CT done but pt reports "urine, all blood work ok." States "dull pain 3-4/10" radiates to lower abd.with "some numbness." Says UC said the numbness was from "a nerve." 1 loose stool this am and mild rash developed over night, quarter size mid back and smaller at abd. No tenderness/ pain of rash area. Unable to make it to appt today, scheduled for next available this Friday with Ria ClockLaura Murray. Instructed to call back or go to UC if symptoms worsen, fever occurs, or increase in rash, pain.   Reason for Disposition . MODERATE pain (e.g., interferes with normal activities or awakens from sleep)    Pt unable to make it to appt today. Next available is Friday, scheduled with Ria ClockLaura Murray.  Advised to go to UC if symptoms worsen, fever.  Answer Assessment - Initial Assessment Questions 1. LOCATION: "Where does it hurt?" (e.g., left, right)    Left side, flank area, to lower lt abd. with "numbness." 2. ONSET: "When did the pain start?"     02/14/17.  Seen in UC, reports "urine and blood work all ok, may be my diverticulum." 3. SEVERITY: "How bad is the pain?" (e.g., Scale 1-10; mild, moderate, or severe)   - MILD (1-3): doesn't interfere with normal activities    - MODERATE (4-7): interferes with normal activities or awakens from sleep    - SEVERE (8-10): excruciating pain and patient unable to do normal activities (stays in bed)       3/10 at rest, 4/10 with movement. 4. PATTERN: "Does the pain come and go, or is it constant?"     "Dull type is constant" 5. CAUSE: "What do you think is causing the pain?"     "Not sure"  H/O diverticulum "on right side only" UC prescribed antibiotics x 7 days pt has taken and Naproxen, reports"little" relief with meds.  6. OTHER SYMPTOMS:  "Do you have any other symptoms?" (e.g., fever, abdominal pain, vomiting, leg weakness,  burning with urination, blood in urine)     Rash..quarter size mid back, smaller one "under stomach" Denies tenderness of rash area, some itching, loose stool this AM.  Protocols used: FLANK PAIN-A-AH

## 2017-02-27 ENCOUNTER — Encounter: Payer: Self-pay | Admitting: Family

## 2017-02-27 ENCOUNTER — Ambulatory Visit (INDEPENDENT_AMBULATORY_CARE_PROVIDER_SITE_OTHER): Payer: Medicare HMO | Admitting: Family

## 2017-02-27 VITALS — BP 142/74 | HR 65 | Temp 97.6°F | Resp 16 | Ht 67.0 in | Wt 191.0 lb

## 2017-02-27 DIAGNOSIS — B029 Zoster without complications: Secondary | ICD-10-CM

## 2017-02-27 MED ORDER — HYDROCODONE-ACETAMINOPHEN 5-325 MG PO TABS: 1.0000 | ORAL_TABLET | Freq: Four times a day (QID) | ORAL | 0 refills | Status: AC | PRN

## 2017-02-27 MED ORDER — PREGABALIN 75 MG PO CAPS: 75.0000 mg | ORAL_CAPSULE | Freq: Two times a day (BID) | ORAL | 0 refills | Status: DC

## 2017-02-27 MED ORDER — VALACYCLOVIR HCL 1 G PO TABS: 1000.0000 mg | ORAL_TABLET | Freq: Three times a day (TID) | ORAL | 0 refills | Status: DC

## 2017-02-27 NOTE — Patient Instructions (Signed)

## 2017-02-27 NOTE — Progress Notes (Signed)
Anne Palmer is a 61 y.o. female with the following history as recorded in [employer]:  Patient Active Problem List   Diagnosis Date Noted  . Hyperlipidemia 12/09/2016  . Osteopenia 12/09/2016  . Upper back pain, chronic 12/09/2016  . Chronic sinusitis 06/05/2016  . HTN (hypertension) 03/07/2016  . Headache around the eyes 03/07/2016  . Bilateral chronic knee pain 03/07/2016  . Postmenopausal state 03/07/2016  . Hyperglycemia 03/07/2016  . Tobacco use disorder 03/07/2016    Current Outpatient Medications  Medication Sig Dispense Refill  . albuterol (PROVENTIL HFA;VENTOLIN HFA) 108 (90 Base) MCG/ACT inhaler Inhale 2 puffs into the lungs every 6 (six) hours as needed for wheezing or shortness of breath. 1 Inhaler 2  . atorvastatin (LIPITOR) 40 MG tablet Take 1 tablet (40 mg total) by mouth daily at 6 PM. 90 tablet 1  . Butalbital-Acetaminophen 50-300 MG TABS Take 1 tablet by mouth 3 (three) times daily as needed. 30 tablet 1  . calcium carbonate (CALCIUM 600) 600 MG TABS tablet Take 1 tablet (600 mg total) by mouth 2 (two) times daily with a meal. 60 tablet   . fluticasone (FLONASE) 50 MCG/ACT nasal spray Place 2 sprays into both nostrils daily. 16 g 6  . furosemide (LASIX) 20 MG tablet Take 1 tablet (20 mg total) by mouth daily. 3 tablet 0  . lisinopril-hydrochlorothiazide (PRINZIDE,ZESTORETIC) 20-12.5 MG tablet Take 1 tablet by mouth daily. 90 tablet 1  . naproxen (NAPROSYN) 500 MG tablet Take 1 tablet (500 mg total) by mouth 2 (two) times daily. 30 tablet 0  . potassium chloride SA (K-DUR,KLOR-CON) 20 MEQ tablet Take 1 tablet (20 mEq total) by mouth daily. 3 tablet 0  . venlafaxine XR (EFFEXOR-XR) 37.5 MG 24 hr capsule Take 1 capsule (37.5 mg total) by mouth daily with breakfast. 90 capsule 1  . HYDROcodone-acetaminophen (NORCO) 5-325 MG tablet Take 1 tablet by mouth every 6 (six) hours as needed for up to 5 days for moderate pain. 20 tablet 0  . pregabalin (LYRICA) 75 MG capsule Take  1 capsule (75 mg total) by mouth 2 (two) times daily. 28 capsule 0  . valACYclovir (VALTREX) 1000 MG tablet Take 1 tablet (1,000 mg total) by mouth 3 (three) times daily. 21 tablet 0   No current facility-administered medications for this visit.     Allergies: Sulfa antibiotics  Past Medical History:  Diagnosis Date  . Arthritis   . Diverticulitis 2015  . Hematuria   . Hypertension     Past Surgical History:  Procedure Laterality Date  . ABDOMINAL HYSTERECTOMY     secondary to endometriosis and fibroids  . COLONOSCOPY  2015   no polyps, diverticulosis  . SHOULDER SURGERY  2008   left    Family History  Problem Relation Age of Onset  . Arthritis Mother   . Stroke Mother   . Hypertension Mother   . Diabetes Mother   . Alcohol abuse Father   . Arthritis Sister   . Hyperlipidemia Sister   . Diabetes Sister   . Lupus Sister   . Arthritis Brother   . Diabetes Brother     Social History   Tobacco Use  . Smoking status: Current Every Day Smoker    Packs/day: 0.25  . Smokeless tobacco: Never Used  Substance Use Topics  . Alcohol use: Yes    Comment: occasional    Subjective:  Patient presents with concerns for persisting left flank/ back pain; was seen at ER on 12/15 and treated  for diverticulitis; notes that pain has not improved and "broke out in a rash" over left hip earlier this week; notes that rash is "itchy and burning."   Objective:  Vitals:   02/27/17 1035  BP: (!) 142/74  Pulse: 65  Resp: 16  Temp: 97.6 F (36.4 C)  TempSrc: Oral  SpO2: 98%  Weight: 191 lb (86.6 kg)  Height: 5\' 7"  (1.702 m)    General: Well developed, well nourished, in no acute distress  Skin : Warm and dry. Vesicular erythematous lesions noted along left flank; does not cross mid-line of body Head: Normocephalic and atraumatic  Eyes: Sclera and conjunctiva clear; pupils round and reactive to light; extraocular movements intact  Ears: External normal; canals clear; tympanic  membranes normal  Oropharynx: Pink, supple. No suspicious lesions  Neck: Supple without thyromegaly, adenopathy  Lungs: Respirations unlabored; clear to auscultation bilaterally without wheeze, rales, rhonchi  CVS exam: normal rate and regular rhythm.  Abdomen: Soft; nontender; nondistended; normoactive bowel sounds; no masses or hepatosplenomegaly  Musculoskeletal: No deformities; no active joint inflammation  Extremities: No edema, cyanosis, clubbing  Vessels: Symmetric bilaterally  Neurologic: Alert and oriented; speech intact; face symmetrical; moves all extremities well; CNII-XII intact without focal deficit  Assessment:  1. Herpes zoster without complication     Plan:  Rx for Valtrex 1 gm tid x 7 days; sample of Lyrica 75 mg bid x 2 weeks; she is also given Rx for Norco 5/325 1 po q 4-6 hours prn pain #20/ 0 Rf; follow-up worse, no better- may need to consider oral prednisone;  No Follow-up on file.  No orders of the defined types were placed in this encounter.   Requested Prescriptions   Signed Prescriptions Disp Refills  . valACYclovir (VALTREX) 1000 MG tablet 21 tablet 0    Sig: Take 1 tablet (1,000 mg total) by mouth 3 (three) times daily.  . pregabalin (LYRICA) 75 MG capsule 28 capsule 0    Sig: Take 1 capsule (75 mg total) by mouth 2 (two) times daily.  Marland Kitchen. HYDROcodone-acetaminophen (NORCO) 5-325 MG tablet 20 tablet 0    Sig: Take 1 tablet by mouth every 6 (six) hours as needed for up to 5 days for moderate pain.

## 2017-05-08 ENCOUNTER — Other Ambulatory Visit: Payer: Self-pay

## 2017-05-08 DIAGNOSIS — Z780 Asymptomatic menopausal state: Secondary | ICD-10-CM

## 2017-05-08 DIAGNOSIS — E785 Hyperlipidemia, unspecified: Secondary | ICD-10-CM

## 2017-05-08 DIAGNOSIS — I10 Essential (primary) hypertension: Secondary | ICD-10-CM

## 2017-05-08 MED ORDER — VENLAFAXINE HCL ER 37.5 MG PO CP24: 37.5000 mg | ORAL_CAPSULE | Freq: Every day | ORAL | 0 refills | Status: DC

## 2017-05-08 MED ORDER — ATORVASTATIN CALCIUM 40 MG PO TABS: 40.0000 mg | ORAL_TABLET | Freq: Every day | ORAL | 0 refills | Status: DC

## 2017-05-08 MED ORDER — LISINOPRIL-HYDROCHLOROTHIAZIDE 20-12.5 MG PO TABS: 1.0000 | ORAL_TABLET | Freq: Every day | ORAL | 0 refills | Status: DC

## 2017-06-10 ENCOUNTER — Ambulatory Visit: Payer: Medicare HMO | Admitting: Nurse Practitioner

## 2017-06-22 ENCOUNTER — Other Ambulatory Visit (INDEPENDENT_AMBULATORY_CARE_PROVIDER_SITE_OTHER): Payer: Medicare HMO

## 2017-06-22 ENCOUNTER — Ambulatory Visit (INDEPENDENT_AMBULATORY_CARE_PROVIDER_SITE_OTHER)
Admission: RE | Admit: 2017-06-22 | Discharge: 2017-06-22 | Disposition: A | Discharge status: Home / Self Care | Payer: Medicare HMO | Source: Ambulatory Visit | Attending: Family | Admitting: Family

## 2017-06-22 ENCOUNTER — Ambulatory Visit (INDEPENDENT_AMBULATORY_CARE_PROVIDER_SITE_OTHER): Payer: Medicare HMO | Admitting: Family

## 2017-06-22 ENCOUNTER — Encounter: Payer: Self-pay | Admitting: Family

## 2017-06-22 VITALS — BP 124/80 | HR 69 | Temp 97.9°F | Ht 67.0 in | Wt 191.0 lb

## 2017-06-22 DIAGNOSIS — R1010 Upper abdominal pain, unspecified: Secondary | ICD-10-CM

## 2017-06-22 DIAGNOSIS — R600 Localized edema: Secondary | ICD-10-CM | POA: Diagnosis not present

## 2017-06-22 DIAGNOSIS — E782 Mixed hyperlipidemia: Secondary | ICD-10-CM | POA: Diagnosis not present

## 2017-06-22 DIAGNOSIS — R7303 Prediabetes: Secondary | ICD-10-CM

## 2017-06-22 DIAGNOSIS — G8929 Other chronic pain: Secondary | ICD-10-CM

## 2017-06-22 DIAGNOSIS — I10 Essential (primary) hypertension: Secondary | ICD-10-CM

## 2017-06-22 DIAGNOSIS — Z1211 Encounter for screening for malignant neoplasm of colon: Secondary | ICD-10-CM | POA: Diagnosis not present

## 2017-06-22 DIAGNOSIS — Z1231 Encounter for screening mammogram for malignant neoplasm of breast: Secondary | ICD-10-CM | POA: Diagnosis not present

## 2017-06-22 DIAGNOSIS — M25562 Pain in left knee: Secondary | ICD-10-CM

## 2017-06-22 LAB — CBC WITH DIFFERENTIAL/PLATELET
BASOS ABS: 0.1 10*3/uL (ref 0.0–0.1)
Basophils Relative: 1.4 % (ref 0.0–3.0)
EOS ABS: 0.1 10*3/uL (ref 0.0–0.7)
Eosinophils Relative: 1.6 % (ref 0.0–5.0)
HEMATOCRIT: 41.2 % (ref 36.0–46.0)
HEMOGLOBIN: 13.7 g/dL (ref 12.0–15.0)
Lymphocytes Relative: 36.1 % (ref 12.0–46.0)
Lymphs Abs: 2.7 10*3/uL (ref 0.7–4.0)
MCHC: 33.2 g/dL (ref 30.0–36.0)
MCV: 91.5 fl (ref 78.0–100.0)
MONOS PCT: 6.2 % (ref 3.0–12.0)
Monocytes Absolute: 0.5 10*3/uL (ref 0.1–1.0)
NEUTROS ABS: 4.1 10*3/uL (ref 1.4–7.7)
Neutrophils Relative %: 54.7 % (ref 43.0–77.0)
PLATELETS: 247 10*3/uL (ref 150.0–400.0)
RBC: 4.5 Mil/uL (ref 3.87–5.11)
RDW: 13.7 % (ref 11.5–15.5)
WBC: 7.4 10*3/uL (ref 4.0–10.5)

## 2017-06-22 LAB — HEMOGLOBIN A1C: Hgb A1c MFr Bld: 5.9 % (ref 4.6–6.5)

## 2017-06-22 LAB — LIPID PANEL
CHOL/HDL RATIO: 4
CHOLESTEROL: 151 mg/dL (ref 0–200)
HDL: 42.1 mg/dL (ref >39.00)
LDL CALC: 82 mg/dL (ref 0–99)
NonHDL: 108.47
TRIGLYCERIDES: 133 mg/dL (ref 0.0–149.0)
VLDL: 26.6 mg/dL (ref 0.0–40.0)

## 2017-06-22 LAB — COMPREHENSIVE METABOLIC PANEL
ALBUMIN: 3.4 g/dL — AB (ref 3.5–5.2)
ALT: 10 U/L (ref 0–35)
AST: 11 U/L (ref 0–37)
Alkaline Phosphatase: 76 U/L (ref 39–117)
BILIRUBIN TOTAL: 0.4 mg/dL (ref 0.2–1.2)
BUN: 14 mg/dL (ref 6–23)
CO2: 25 meq/L (ref 19–32)
Calcium: 8.8 mg/dL (ref 8.4–10.5)
Chloride: 111 mEq/L (ref 96–112)
Creatinine, Ser: 0.58 mg/dL (ref 0.40–1.20)
GFR: 135.97 mL/min (ref >60.00)
Glucose, Bld: 93 mg/dL (ref 70–99)
Potassium: 4.6 mEq/L (ref 3.5–5.1)
Sodium: 143 mEq/L (ref 135–145)
Total Protein: 5.8 g/dL — ABNORMAL LOW (ref 6.0–8.3)

## 2017-06-22 LAB — BRAIN NATRIURETIC PEPTIDE: Pro B Natriuretic peptide (BNP): 50 pg/mL (ref 0.0–100.0)

## 2017-06-22 MED ORDER — LISINOPRIL-HYDROCHLOROTHIAZIDE 20-25 MG PO TABS: 1.0000 | ORAL_TABLET | Freq: Every day | ORAL | 1 refills | Status: DC

## 2017-06-22 NOTE — Progress Notes (Signed)
Anne Palmer is a 62 y.o. female with the following history as recorded in EpicCare:  Patient Active Problem List   Diagnosis Date Noted  . Hyperlipidemia 12/09/2016  . Osteopenia 12/09/2016  . Upper back pain, chronic 12/09/2016  . Chronic sinusitis 06/05/2016  . HTN (hypertension) 03/07/2016  . Headache around the eyes 03/07/2016  . Bilateral chronic knee pain 03/07/2016  . Postmenopausal state 03/07/2016  . Hyperglycemia 03/07/2016  . Tobacco use disorder 03/07/2016    Current Outpatient Medications  Medication Sig Dispense Refill  . albuterol (PROVENTIL HFA;VENTOLIN HFA) 108 (90 Base) MCG/ACT inhaler Inhale 2 puffs into the lungs every 6 (six) hours as needed for wheezing or shortness of breath. 1 Inhaler 2  . atorvastatin (LIPITOR) 40 MG tablet Take 1 tablet (40 mg total) by mouth daily at 6 PM. 90 tablet 0  . calcium carbonate (CALCIUM 600) 600 MG TABS tablet Take 1 tablet (600 mg total) by mouth 2 (two) times daily with a meal. 60 tablet   . fluticasone (FLONASE) 50 MCG/ACT nasal spray Place 2 sprays into both nostrils daily. 16 g 6  . naproxen (NAPROSYN) 500 MG tablet Take 1 tablet (500 mg total) by mouth 2 (two) times daily. 30 tablet 0  . venlafaxine XR (EFFEXOR-XR) 37.5 MG 24 hr capsule Take 1 capsule (37.5 mg total) by mouth daily with breakfast. 90 capsule 0  . lisinopril-hydrochlorothiazide (PRINZIDE,ZESTORETIC) 20-25 MG tablet Take 1 tablet by mouth daily. 90 tablet 1   No current facility-administered medications for this visit.     Allergies: Sulfa antibiotics  Past Medical History:  Diagnosis Date  . Arthritis   . Diverticulitis 2015  . Hematuria   . Hypertension     Past Surgical History:  Procedure Laterality Date  . ABDOMINAL HYSTERECTOMY     secondary to endometriosis and fibroids  . COLONOSCOPY  2015   no polyps, diverticulosis  . SHOULDER SURGERY  2008   left    Family History  Problem Relation Age of Onset  . Arthritis Mother   . Stroke  Mother   . Hypertension Mother   . Diabetes Mother   . Alcohol abuse Father   . Arthritis Sister   . Hyperlipidemia Sister   . Diabetes Sister   . Lupus Sister   . Arthritis Brother   . Diabetes Brother     Social History   Tobacco Use  . Smoking status: Current Every Day Smoker    Packs/day: 0.25  . Smokeless tobacco: Never Used  Substance Use Topics  . Alcohol use: Yes    Comment: occasional    Subjective:  Patient presents to transfer her care from another provider who has left this clinic; needs to follow-up on her chronic care needs including Hypertension, Hyperlipidemia, Pre-diabetes; is concerned about chronic swelling in her lower legs- actually took an extra dose of her blood pressure medication yesterday to help with the swelling; had doppler last summer which did not show DVT; is tolerating her Lipitor well with no concerns- no myalgias; is fasting and would like to get labs done;    Objective:  Vitals:   06/22/17 1002  BP: 124/80  Pulse: 69  Temp: 97.9 F (36.6 C)  TempSrc: Oral  SpO2: 99%  Weight: 191 lb (86.6 kg)  Height: _0  (1.702 m)    General: Well developed, well nourished, in no acute distress  Skin : Warm and dry.  Head: Normocephalic and atraumatic  Eyes: Sclera and conjunctiva clear; pupils round and  reactive to light; extraocular movements intact  Ears: External normal; canals clear; tympanic membranes normal  Oropharynx: Pink, supple. No suspicious lesions  Neck: Supple without thyromegaly, adenopathy  Lungs: Respirations unlabored; clear to auscultation bilaterally without wheeze, rales, rhonchi  CVS exam: normal rate and regular rhythm.  Abdomen: Soft; nontender; nondistended; normoactive bowel sounds; no masses or hepatosplenomegaly  Musculoskeletal: No deformities; no active joint inflammation; crepitus noted in left knee  Extremities: bilateral pedal edema- pitting; right greater than left, no cyanosis, clubbing  Vessels: Symmetric  bilaterally  Neurologic: Alert and oriented; speech intact; face symmetrical; moves all extremities well; CNII-XII intact without focal deficit  Assessment:  1. Essential hypertension   2. Pedal edema   3. Mixed hyperlipidemia   4. Pre-diabetes   5. Chronic pain of left knee   6. Pain of upper abdomen   7. Encounter for screening colonoscopy   8. Screening mammogram, encounter for     Plan:  1. Try increasing Lisinopril to 20/25 qd; follow-up in 1 month; may need to consider trial of Lasix; 2. Check BNP, CXR today; follow-up to be determined; may need to consider imaging of abdomen; patient is s/p hysterectomy but does have one ovary;  3. Check lipid panel; continue Lipitor for now; 4. Encouraged exercise, weight loss, quit smoking; update Hgba1c today; 5. Update left knee x-ray; will need to see orthopedist or sports medicine;  6. ? Gallbladder attacks based on description of recurrent vomiting; update abdominal ultrasound; 7. Refer to GI- last colonoscopy in 2014; per patient, she was due for 5 year follow-up;  8. Order for mammogram updated;  Discussed need to update Prevnar- she defers until next OV; she thinks her Tdap is up to date as of 2013- will check her records;      Return in about 1 month (around 07/20/2017) for blood pressure check.  Orders Placed This Encounter  Procedures  . DG Chest 2 View    Standing Status:   Future    Number of Occurrences:   1    Standing Expiration Date:   08/23/2018    Order Specific Question:   Reason for Exam (SYMPTOM  OR DIAGNOSIS REQUIRED)    Answer:   pedal edema    Order Specific Question:   Is patient pregnant?    Answer:   No    Order Specific Question:   Preferred imaging location?    Answer:   [facility]    Order Specific Question:   Radiology Contrast Protocol - do NOT remove file path    Answer:   \\[email or web address]\DXFluoroContrastProtocols.pdf  . DG Knee Complete 4 Views Left    Standing Status:   Future     Number of Occurrences:   1    Standing Expiration Date:   08/23/2018    Order Specific Question:   Reason for Exam (SYMPTOM  OR DIAGNOSIS REQUIRED)    Answer:   knee pain    Order Specific Question:   Is patient pregnant?    Answer:   No    Order Specific Question:   Preferred imaging location?    Answer:   [facility]    Order Specific Question:   Radiology Contrast Protocol - do NOT remove file path    Answer:   \\[email or web address]\DXFluoroContrastProtocols.pdf  . MM Digital Screening    Standing Status:   Future    Standing Expiration Date:   08/23/2018    Order Specific Question:   Reason for Exam (SYMPTOM  OR DIAGNOSIS REQUIRED)  Answer:   screening mammogram    Order Specific Question:   Is the patient pregnant?    Answer:   No    Order Specific Question:   Preferred imaging location?    Answer:   [facility]  . US Abdomen Complete    Standing Status:   Future    Standing Expiration Date:   08/23/2018    Order Specific Question:   Reason for Exam (SYMPTOM  OR DIAGNOSIS REQUIRED)    Answer:   abdominal pain    Order Specific Question:   Preferred imaging location?    Answer:   [facility]  . CBC w/Diff    Standing Status:   Future    Number of Occurrences:   1    Standing Expiration Date:   06/22/2018  . Comp Met (CMET)    Standing Status:   Future    Number of Occurrences:   1    Standing Expiration Date:   06/22/2018  . Lipid panel    Standing Status:   Future    Number of Occurrences:   1    Standing Expiration Date:   06/23/2018  . HgB A1c    Standing Status:   Future    Number of Occurrences:   1    Standing Expiration Date:   06/22/2018  . B Nat Peptide    Standing Status:   Future    Number of Occurrences:   1    Standing Expiration Date:   06/22/2018  . Ambulatory referral to Gastroenterology    Referral Priority:   Routine    Referral Type:   Consultation    Referral Reason:   Specialty Services Required    Number of Visits  Requested:   1    Requested Prescriptions   Signed Prescriptions Disp Refills  . lisinopril-hydrochlorothiazide (PRINZIDE,ZESTORETIC) 20-25 MG tablet 90 tablet 1    Sig: Take 1 tablet by mouth daily.

## 2017-06-22 NOTE — Progress Notes (Signed)
Recommended to come back and see sports medicine provider;

## 2017-06-23 ENCOUNTER — Telehealth: Payer: Self-pay

## 2017-06-23 ENCOUNTER — Ambulatory Visit: Payer: Medicare HMO | Admitting: Nurse Practitioner

## 2017-06-23 NOTE — Telephone Encounter (Signed)
Called and left message for patient to return call to office and let me know where and what month she had it done so I can obtain copy of report to update her  Medical history as completing test.

## 2017-06-23 NOTE — Telephone Encounter (Signed)
Patient returned call to clinic. She states her colonoscopy was done in 2015 instead of 2014. Call and find out where she had this done and obtain copy for metrics being met.

## 2017-06-25 ENCOUNTER — Telehealth: Payer: Self-pay

## 2017-06-25 NOTE — Telephone Encounter (Signed)
Patient had colonoscopy done in 2015 and she has requested copy of records be sent to us so we can update her medical history.

## 2017-06-25 NOTE — Telephone Encounter (Signed)
Follow up with office and make sure request for records received and sent to our office.

## 2017-06-25 NOTE — Telephone Encounter (Signed)
Pt returned call on Tuesday.  [facility] Rayford [facility]stedIndran Krishanan  [facility]  phone # [phone]  She did sign paper to have records transferred.

## 2017-06-30 ENCOUNTER — Encounter: Payer: Self-pay | Admitting: Family Medicine

## 2017-06-30 ENCOUNTER — Ambulatory Visit (INDEPENDENT_AMBULATORY_CARE_PROVIDER_SITE_OTHER): Payer: Medicare HMO | Admitting: Family Medicine

## 2017-06-30 DIAGNOSIS — M222X2 Patellofemoral disorders, left knee: Secondary | ICD-10-CM | POA: Diagnosis not present

## 2017-06-30 DIAGNOSIS — M222X9 Patellofemoral disorders, unspecified knee: Secondary | ICD-10-CM | POA: Insufficient documentation

## 2017-06-30 MED ORDER — DICLOFENAC SODIUM 2 % TD SOLN: 1.0000 "application " | Freq: Two times a day (BID) | TRANSDERMAL | 3 refills | Status: DC

## 2017-06-30 NOTE — Assessment & Plan Note (Signed)
Pain likely related to PF syndrome. Doesn't appear to be arthritic.  - pennsaid  - counseled on HEP  - counseled on ice and compression  - if no improvement consider injection vs PT

## 2017-06-30 NOTE — Progress Notes (Signed)
Anne Palmer - 62 y.o. female MRN [id number]  Date of birth: [birth year]  SUBJECTIVE:  Including CC & ROS.  Chief Complaint  Patient presents with  . left knee pain    Anne Palmer is a 62 y.o. female that is presenting with left knee pain. Pain is chronic nature, has been increasing over the past two months. She fell in 2015 on her knee and developed cellulitis. Pain is intermittent sharp shooting. Denies flexion or extension trigger the pain. Denies swelling or tenderness. Pain is located in the anterior midportion of her knee. Admits to weakness. She has been taking tylenol for the pain.  Independent review of the left knee x-ray from 4/22 shows no significant arthritic change.   Review of Systems  Constitutional: Negative for fever.  Respiratory: Negative for cough.   Cardiovascular: Negative for chest pain.  Gastrointestinal: Negative for abdominal pain.  Allergic/Immunologic: Negative for immunocompromised state.  Neurological: Negative for dizziness.  Hematological: Negative for adenopathy.  Psychiatric/Behavioral: Negative for agitation.    HISTORY: Past Medical, Surgical, Social, and Family History Reviewed & Updated per EMR.   Pertinent Historical Findings include:  Past Medical History:  Diagnosis Date  . Arthritis   . Diverticulitis 2015  . Hematuria   . Hypertension     Past Surgical History:  Procedure Laterality Date  . ABDOMINAL HYSTERECTOMY     secondary to endometriosis and fibroids  . COLONOSCOPY  2015   no polyps, diverticulosis  . SHOULDER SURGERY  2008   left    Allergies  Allergen Reactions  . Sulfa Antibiotics Hives    Family History  Problem Relation Age of Onset  . Arthritis Mother   . Stroke Mother   . Hypertension Mother   . Diabetes Mother   . Alcohol abuse Father   . Arthritis Sister   . Hyperlipidemia Sister   . Diabetes Sister   . Lupus Sister   . Arthritis Brother   . Diabetes Brother      Social History    Socioeconomic History  . Marital status: Single    Spouse name: Not on file  . Number of children: Not on file  . Years of education: Not on file  . Highest education level: Not on file  Occupational History  . Not on file  Social Needs  . Financial resource strain: Not on file  . Food insecurity:    Worry: Not on file    Inability: Not on file  . Transportation needs:    Medical: Not on file    Non-medical: Not on file  Tobacco Use  . Smoking status: Current Every Day Smoker    Packs/day: 0.25  . Smokeless tobacco: Never Used  Substance and Sexual Activity  . Alcohol use: Yes    Comment: occasional  . Drug use: No  . Sexual activity: Not Currently    Birth control/protection: None  Lifestyle  . Physical activity:    Days per week: Not on file    Minutes per session: Not on file  . Stress: Not on file  Relationships  . Social connections:    Talks on phone: Not on file    Gets together: Not on file    Attends religious service: Not on file    Active member of club or organization: Not on file    Attends meetings of clubs or organizations: Not on file    Relationship status: Not on file  . Intimate partner violence:    Fear  of current or ex partner: Not on file    Emotionally abused: Not on file    Physically abused: Not on file    Forced sexual activity: Not on file  Other Topics Concern  . Not on file  Social History Narrative  . Not on file     PHYSICAL EXAM:  VS: BP 132/78 (BP Location: Left Arm, Patient Position: Sitting, Cuff Size: Normal)   Pulse 67   Temp 97.8 F (36.6 C) (Oral)   Ht  (1.702 m)   SpO2 100%   BMI 29.91 kg/m  Physical Exam Gen: NAD, alert, cooperative with exam, well-appearing ENT: normal lips, normal nasal mucosa,  Eye: normal EOM, normal conjunctiva and lids CV:  no edema, +2 pedal pulses   Resp: no accessory muscle use, non-labored,  Skin: no rashes, no areas of induration  Neuro: normal tone, normal sensation to  touch Psych:  normal insight, alert and oriented MSK:  Left knee:  No effusion  Normal flexion and extension  No instability  Negative McMurray's  Normal gait  Weakness hip abduction  Neurovascularly intact      ASSESSMENT & PLAN:   Patellofemoral syndrome Pain likely related to PF syndrome. Doesn't appear to be arthritic.  - pennsaid  - counseled on HEP  - counseled on ice and compression  - if no improvement consider injection vs PT

## 2017-06-30 NOTE — Patient Instructions (Signed)
Please try to place ice and compression on your knee. Please try the exercises I provided. Please try the Pennsaid to rub onto the area. Please follow up with me in 4-6 weeks if your symptoms haven't improved.

## 2017-07-02 ENCOUNTER — Telehealth: Payer: Self-pay | Admitting: Family Medicine

## 2017-07-02 MED ORDER — DICLOFENAC SODIUM 2 % TD SOLN: 1.0000 "application " | Freq: Two times a day (BID) | TRANSDERMAL | 3 refills | Status: DC

## 2017-07-02 NOTE — Telephone Encounter (Signed)
Pt call to let Riki Rusk know that the following med was not covered by [employer] they said she did not qualify and is asking if the med can be sent to [facility]  PENNSAID  Pt phone number [phone]

## 2017-07-02 NOTE — Addendum Note (Signed)
Addended by: Milas Kocher A on: 07/02/2017 04:50 PM   Modules accepted: Orders

## 2017-07-02 NOTE — Telephone Encounter (Signed)
Spoke with patient, sent in to [facility] mail order.

## 2017-07-07 ENCOUNTER — Telehealth: Payer: Self-pay | Admitting: Family

## 2017-07-07 NOTE — Telephone Encounter (Signed)
Copied from CRM [id number]. Topic: Quick Communication - See Telephone Encounter >> Jul 07, 2017 11:07 AM Landry Mellow wrote: CRM for notification. See Telephone encounter for: 07/07/17. Pt was told to have provider to call with instructions for Diclofenac Sodium (PENNSAID) 2 % SOLN Cb is [phone]

## 2017-07-08 NOTE — Telephone Encounter (Signed)
Called [facility] pharmacy to verify RX and will mail order to patient.

## 2017-07-09 NOTE — Telephone Encounter (Signed)
Called this morning and spoke with Misty Stanley. She states records were faxed back in January but to [facility]. She was going to fax updated copy of colonoscopy to my attention today.

## 2017-07-20 ENCOUNTER — Ambulatory Visit
Admission: RE | Admit: 2017-07-20 | Discharge: 2017-07-20 | Disposition: A | Discharge status: Home / Self Care | Payer: Medicare HMO | Source: Ambulatory Visit | Attending: Family | Admitting: Family

## 2017-07-20 ENCOUNTER — Ambulatory Visit: Payer: Medicare HMO | Admitting: Family

## 2017-07-20 DIAGNOSIS — R1010 Upper abdominal pain, unspecified: Secondary | ICD-10-CM | POA: Diagnosis not present

## 2017-07-22 ENCOUNTER — Encounter: Payer: Self-pay | Admitting: Family

## 2017-07-30 NOTE — Telephone Encounter (Signed)
Called again because records still hadn't been received. Spoke with Judeth Cornfield today from Dr. Aniceto Boss office. Faxed over medical form to her attention today. Waiting for faxed records to update her Colonoscopy records.

## 2017-07-31 ENCOUNTER — Encounter: Payer: Self-pay | Admitting: Family

## 2017-07-31 NOTE — Telephone Encounter (Signed)
Outside notes received. Information abstracted. Notes sent to scan.  

## 2017-08-03 ENCOUNTER — Ambulatory Visit
Admission: RE | Admit: 2017-08-03 | Discharge: 2017-08-03 | Disposition: A | Discharge status: Home / Self Care | Payer: Medicare HMO | Source: Ambulatory Visit | Attending: Family | Admitting: Family

## 2017-08-03 DIAGNOSIS — Z1231 Encounter for screening mammogram for malignant neoplasm of breast: Secondary | ICD-10-CM

## 2017-08-31 ENCOUNTER — Other Ambulatory Visit: Payer: Self-pay | Admitting: Family

## 2017-08-31 ENCOUNTER — Encounter: Payer: Self-pay | Admitting: Family

## 2017-08-31 DIAGNOSIS — Z780 Asymptomatic menopausal state: Secondary | ICD-10-CM

## 2017-08-31 DIAGNOSIS — E785 Hyperlipidemia, unspecified: Secondary | ICD-10-CM

## 2017-08-31 MED ORDER — LISINOPRIL-HYDROCHLOROTHIAZIDE 20-25 MG PO TABS: 1.0000 | ORAL_TABLET | Freq: Every day | ORAL | 0 refills | Status: DC

## 2017-08-31 MED ORDER — ATORVASTATIN CALCIUM 40 MG PO TABS
40.0000 mg | ORAL_TABLET | Freq: Every day | ORAL | 0 refills | Status: DC
Start: 2017-08-31 — End: 2017-11-26

## 2017-08-31 MED ORDER — VENLAFAXINE HCL ER 37.5 MG PO CP24: 37.5000 mg | ORAL_CAPSULE | Freq: Every day | ORAL | 0 refills | Status: DC

## 2017-08-31 NOTE — Telephone Encounter (Signed)
Would you like to just fax her meds in for her?

## 2017-09-14 DIAGNOSIS — H5213 Myopia, bilateral: Secondary | ICD-10-CM | POA: Diagnosis not present

## 2017-11-26 ENCOUNTER — Other Ambulatory Visit: Payer: Self-pay | Admitting: Family

## 2017-11-26 ENCOUNTER — Telehealth: Payer: Self-pay

## 2017-11-26 DIAGNOSIS — J324 Chronic pansinusitis: Secondary | ICD-10-CM

## 2017-11-26 DIAGNOSIS — Z780 Asymptomatic menopausal state: Secondary | ICD-10-CM

## 2017-11-26 DIAGNOSIS — E785 Hyperlipidemia, unspecified: Secondary | ICD-10-CM

## 2017-11-26 MED ORDER — FLUTICASONE PROPIONATE 50 MCG/ACT NA SUSP: 2.0000 | Freq: Every day | NASAL | 0 refills | Status: DC

## 2017-11-26 MED ORDER — LISINOPRIL-HYDROCHLOROTHIAZIDE 20-25 MG PO TABS
1.0000 | ORAL_TABLET | Freq: Every day | ORAL | 0 refills | Status: DC
Start: 2017-11-26 — End: 2018-03-29

## 2017-11-26 MED ORDER — VENLAFAXINE HCL ER 37.5 MG PO CP24: 37.5000 mg | ORAL_CAPSULE | Freq: Every day | ORAL | 0 refills | Status: DC

## 2017-11-26 MED ORDER — ATORVASTATIN CALCIUM 40 MG PO TABS: 40.0000 mg | ORAL_TABLET | Freq: Every day | ORAL | 0 refills | Status: DC

## 2017-11-26 NOTE — Telephone Encounter (Signed)
Prescriptions sent; keep upcoming appointment for October.

## 2017-11-26 NOTE — Telephone Encounter (Signed)
Patient would like to have all her meds refilled and sent to [facility] mail order.  Thanks

## 2017-11-27 NOTE — Telephone Encounter (Signed)
Called and left message for patient.

## 2017-12-21 ENCOUNTER — Ambulatory Visit (INDEPENDENT_AMBULATORY_CARE_PROVIDER_SITE_OTHER): Payer: Medicare HMO | Admitting: Family

## 2017-12-21 ENCOUNTER — Other Ambulatory Visit (INDEPENDENT_AMBULATORY_CARE_PROVIDER_SITE_OTHER): Payer: Medicare HMO

## 2017-12-21 ENCOUNTER — Encounter: Payer: Self-pay | Admitting: Family

## 2017-12-21 ENCOUNTER — Ambulatory Visit (INDEPENDENT_AMBULATORY_CARE_PROVIDER_SITE_OTHER)
Admission: RE | Admit: 2017-12-21 | Discharge: 2017-12-21 | Disposition: A | Discharge status: Home / Self Care | Payer: Medicare HMO | Source: Ambulatory Visit | Attending: Family | Admitting: Family

## 2017-12-21 VITALS — BP 106/68 | HR 65 | Temp 97.8°F | Ht 67.0 in | Wt 186.0 lb

## 2017-12-21 DIAGNOSIS — G8929 Other chronic pain: Secondary | ICD-10-CM | POA: Diagnosis not present

## 2017-12-21 DIAGNOSIS — R3915 Urgency of urination: Secondary | ICD-10-CM

## 2017-12-21 DIAGNOSIS — J40 Bronchitis, not specified as acute or chronic: Secondary | ICD-10-CM

## 2017-12-21 DIAGNOSIS — M545 Low back pain, unspecified: Secondary | ICD-10-CM

## 2017-12-21 DIAGNOSIS — J324 Chronic pansinusitis: Secondary | ICD-10-CM

## 2017-12-21 DIAGNOSIS — R7303 Prediabetes: Secondary | ICD-10-CM | POA: Diagnosis not present

## 2017-12-21 DIAGNOSIS — Z780 Asymptomatic menopausal state: Secondary | ICD-10-CM

## 2017-12-21 LAB — COMPREHENSIVE METABOLIC PANEL
ALT: 10 U/L (ref 0–35)
AST: 10 U/L (ref 0–37)
Albumin: 3.7 g/dL (ref 3.5–5.2)
Alkaline Phosphatase: 64 U/L (ref 39–117)
BILIRUBIN TOTAL: 0.5 mg/dL (ref 0.2–1.2)
BUN: 8 mg/dL (ref 6–23)
CO2: 28 meq/L (ref 19–32)
Calcium: 8.9 mg/dL (ref 8.4–10.5)
Chloride: 109 mEq/L (ref 96–112)
Creatinine, Ser: 0.59 mg/dL (ref 0.40–1.20)
GFR: 133.1 mL/min (ref >60.00)
GLUCOSE: 108 mg/dL — AB (ref 70–99)
Potassium: 4.5 mEq/L (ref 3.5–5.1)
SODIUM: 143 meq/L (ref 135–145)
Total Protein: 6.1 g/dL (ref 6.0–8.3)

## 2017-12-21 LAB — HEMOGLOBIN A1C: HEMOGLOBIN A1C: 5.9 % (ref 4.6–6.5)

## 2017-12-21 MED ORDER — ALBUTEROL SULFATE HFA 108 (90 BASE) MCG/ACT IN AERS: 2.0000 | INHALATION_SPRAY | Freq: Four times a day (QID) | RESPIRATORY_TRACT | 2 refills | Status: DC | PRN

## 2017-12-21 MED ORDER — MIRABEGRON ER 25 MG PO TB24: 25.0000 mg | ORAL_TABLET | Freq: Every day | ORAL | Status: DC

## 2017-12-21 MED ORDER — VENLAFAXINE HCL ER 75 MG PO CP24: 75.0000 mg | ORAL_CAPSULE | Freq: Every day | ORAL | 1 refills | Status: DC

## 2017-12-21 NOTE — Progress Notes (Signed)
Anne Palmer is a 62 y.o. female with the following history as recorded in EpicCare:  Patient Active Problem List   Diagnosis Date Noted  . 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 Hyperlipidemia 12/09/2016  . Osteopenia 12/09/2016  . Upper back pain, chronic 12/09/2016  . Chronic sinusitis 06/05/2016  . HTN (hypertension) 03/07/2016  . Headache around the eyes 03/07/2016  . Bilateral chronic knee pain 03/07/2016  . Postmenopausal state 03/07/2016  . Hyperglycemia 03/07/2016  . Tobacco use disorder 03/07/2016    Current Outpatient Medications  Medication Sig Dispense Refill  . albuterol (PROVENTIL HFA;VENTOLIN HFA) 108 (90 Base) MCG/ACT inhaler Inhale 2 puffs into the lungs every 6 (six) hours as needed for wheezing or shortness of breath. 1 Inhaler 2  . atorvastatin (LIPITOR) 40 MG tablet Take 1 tablet (40 mg total) by mouth daily at 6 PM. 90 tablet 0  . calcium carbonate (CALCIUM 600) 600 MG TABS tablet Take 1 tablet (600 mg total) by mouth 2 (two) times daily with a meal. 60 tablet   . Diclofenac Sodium (PENNSAID) 2 % SOLN Place 1 application onto the skin 2 (two) times daily. 1 Bottle 3  . fluticasone (FLONASE) 50 MCG/ACT nasal spray Place 2 sprays into both nostrils daily. 48 g 0  . lisinopril-hydrochlorothiazide (PRINZIDE,ZESTORETIC) 20-25 MG tablet Take 1 tablet by mouth daily. 90 tablet 0  . venlafaxine XR (EFFEXOR-XR) 75 MG 24 hr capsule Take 1 capsule (75 mg total) by mouth daily with breakfast. 90 capsule 1   No current facility-administered medications for this visit.     Allergies: Sulfa antibiotics  Past Medical History:  Diagnosis Date  . Arthritis   . Diverticulitis 2015  . Hematuria   . Hypertension     Past Surgical History:  Procedure Laterality Date  . ABDOMINAL HYSTERECTOMY     secondary to endometriosis and fibroids  . COLONOSCOPY  2015   no polyps, diverticulosis  . SHOULDER SURGERY  2008   left    Family History  Problem Relation Age of Onset   . Arthritis Mother   . Stroke Mother   . Hypertension Mother   . Diabetes Mother   . Alcohol abuse Father   . Arthritis Sister   . Hyperlipidemia Sister   . Diabetes Sister   . Lupus Sister   . Arthritis Brother   . Diabetes Brother     Social History   Tobacco Use  . Smoking status: Current Every Day Smoker    Packs/day: 0.25  . Smokeless tobacco: Never Used  Substance Use Topics  . Alcohol use: Yes    Comment: occasional    Subjective:  Patient presents for a 6 month follow-up; multiple chronic issues: 1) hypertension; 2) hyperlipidemia- tolerating Lipitor well; 3) tobacco abuse; 4) pre-diabetes; 5) situational anxiety Continuing to have problems with bilateral knee pain/ low back pain- last saw sports medicine in April; discussed injections;     Objective:  Vitals:   12/21/17 1009  BP: 106/68  Pulse: 65  Temp: 97.8 F (36.6 C)  TempSrc: Oral  SpO2: 97%  Weight: 186 lb (84.4 kg)  Height: _0  (1.702 m)    General: Well developed, well nourished, in no acute distress  Skin : Warm and dry.  Head: Normocephalic and atraumatic  Lungs: Respirations unlabored; clear to auscultation bilaterally without wheeze, rales, rhonchi  CVS exam: normal rate and regular rhythm.  Musculoskeletal: No deformities; no active joint inflammation  Extremities: No edema, cyanosis, clubbing  Vessels: Symmetric bilaterally  Neurologic: Alert and oriented; speech intact; face symmetrical; moves all extremities well; CNII-XII intact without focal deficit   Assessment:  1. Chronic bilateral low back pain without sciatica   2. Postmenopausal state   3. Chronic pansinusitis   4. Bronchitis   5. Pre-diabetes   6. Urinary urgency     Plan:  1. Update lumbar X-ray; refer back to sports medicine about knee pain; may need injections; 2. Good response to Effexor XR- try increasing to 75 mg daily to help with situational anxiety as well; 3. & 4. Refill updated on albuterol inhaler; 5.  Check CMP, Hgba1c; 6. Trial of Myrbetriq 25 mg qd;   Based on lumbar X-ray, will need to consider having patient hold Lipitor; follow-up to be determined.  Stressed need for her to quit smoking.   Return for Dr. Raeford Razor next Monday at [facility].  Orders Placed This Encounter  Procedures  . Urine Culture    Standing Status:   Future    Number of Occurrences:   1    Standing Expiration Date:   12/21/2018  . DG Lumbar Spine 2-3 Views    Standing Status:   Future    Number of Occurrences:   1    Standing Expiration Date:   02/21/2019    Order Specific Question:   Reason for Exam (SYMPTOM  OR DIAGNOSIS REQUIRED)    Answer:   low back pain    Order Specific Question:   Preferred imaging location?    Answer:   [facility]    Order Specific Question:   Radiology Contrast Protocol - do NOT remove file path    Answer:   \\[email or web address]\DXFluoroContrastProtocols.pdf  . Comp Met (CMET)    Standing Status:   Future    Number of Occurrences:   1    Standing Expiration Date:   12/21/2018  . HgB A1c    Standing Status:   Future    Number of Occurrences:   1    Standing Expiration Date:   12/21/2018    Requested Prescriptions   Signed Prescriptions Disp Refills  . albuterol (PROVENTIL HFA;VENTOLIN HFA) 108 (90 Base) MCG/ACT inhaler 1 Inhaler 2    Sig: Inhale 2 puffs into the lungs every 6 (six) hours as needed for wheezing or shortness of breath.  . venlafaxine XR (EFFEXOR-XR) 75 MG 24 hr capsule 90 capsule 1    Sig: Take 1 capsule (75 mg total) by mouth daily with breakfast.

## 2017-12-22 LAB — URINE CULTURE
MICRO NUMBER: 91262063
RESULT: NO GROWTH
SPECIMEN QUALITY: ADEQUATE

## 2018-01-04 ENCOUNTER — Ambulatory Visit (INDEPENDENT_AMBULATORY_CARE_PROVIDER_SITE_OTHER): Payer: Medicare HMO | Admitting: Family Medicine

## 2018-01-04 ENCOUNTER — Encounter: Payer: Self-pay | Admitting: Family Medicine

## 2018-01-04 ENCOUNTER — Ambulatory Visit (INDEPENDENT_AMBULATORY_CARE_PROVIDER_SITE_OTHER): Payer: Medicare HMO

## 2018-01-04 VITALS — BP 110/70 | HR 74 | Temp 98.6°F | Ht 67.0 in | Wt 186.0 lb

## 2018-01-04 DIAGNOSIS — M25562 Pain in left knee: Secondary | ICD-10-CM

## 2018-01-04 DIAGNOSIS — M25561 Pain in right knee: Secondary | ICD-10-CM | POA: Diagnosis not present

## 2018-01-04 DIAGNOSIS — M1712 Unilateral primary osteoarthritis, left knee: Secondary | ICD-10-CM | POA: Diagnosis not present

## 2018-01-04 DIAGNOSIS — G8929 Other chronic pain: Secondary | ICD-10-CM | POA: Diagnosis not present

## 2018-01-04 NOTE — Patient Instructions (Signed)
Good to see you  Please focus on the exercises. Those will help with the pain  Please try the vimovo. You can come get more samples if needed  Please see me back in 3-4 weeks if the pain doesn't improve  I will call you with the results from today

## 2018-01-04 NOTE — Assessment & Plan Note (Signed)
Pain seems to be consistent with patellofemoral's pain.  Previous x-ray did not demonstrate any significant degenerative changes in the right knee.  History would suggest patellofemoral syndrome as well. -X-ray. -Samples of Vimovo and Pennsaid provided. -If no improvement will consider an injection

## 2018-01-04 NOTE — Progress Notes (Signed)
Anne Palmer - 62 y.o. female MRN [id number]  Date of birth: 1955-10-12  SUBJECTIVE:  Including CC & ROS.  Chief Complaint  Patient presents with  . Knee Pain    pt c/o pain in both knees , left knee worse , 7/10 . pt takes tylenol and pennsaid    Anne Palmer is a 62 y.o. female that is presenting with bilateral knee pain.  The left is worse on the right.  The pain is acute in nature.  The pain is worse going up and down stairs or rising from a seated position.  She denies any locking or giving way.  The pain is moderate to severe.  She denies any swelling.  She has tried Pennsaid.  She denies any radiation.  Denies any radicular symptoms.  Denies any inciting event.  Feels like the symptoms are gotten worse..    Review of Systems  Constitutional: Negative for fever.  HENT: Negative for congestion.   Respiratory: Negative for cough.   Cardiovascular: Negative for chest pain.  Gastrointestinal: Negative for abdominal pain.  Musculoskeletal: Positive for gait problem.  Skin: Negative for color change.  Neurological: Negative for weakness.  Hematological: Negative for adenopathy.  Psychiatric/Behavioral: Negative for agitation.    HISTORY: Past Medical, Surgical, Social, and Family History Reviewed & Updated per EMR.   Pertinent Historical Findings include:  Past Medical History:  Diagnosis Date  . Arthritis   . Diverticulitis 2015  . Hematuria   . Hypertension     Past Surgical History:  Procedure Laterality Date  . ABDOMINAL HYSTERECTOMY     secondary to endometriosis and fibroids  . COLONOSCOPY  2015   no polyps, diverticulosis  . SHOULDER SURGERY  2008   left    Allergies  Allergen Reactions  . Sulfa Antibiotics Hives    Family History  Problem Relation Age of Onset  . Arthritis Mother   . Stroke Mother   . Hypertension Mother   . Diabetes Mother   . Alcohol abuse Father   . Arthritis Sister   . Hyperlipidemia Sister   . Diabetes Sister   . Lupus  Sister   . Arthritis Brother   . Diabetes Brother      Social History   Socioeconomic History  . Marital status: Single    Spouse name: Not on file  . Number of children: Not on file  . Years of education: Not on file  . Highest education level: Not on file  Occupational History  . Not on file  Social Needs  . Financial resource strain: Not on file  . Food insecurity:    Worry: Not on file    Inability: Not on file  . Transportation needs:    Medical: Not on file    Non-medical: Not on file  Tobacco Use  . Smoking status: Current Every Day Smoker    Packs/day: 0.25  . Smokeless tobacco: Never Used  Substance and Sexual Activity  . Alcohol use: Yes    Comment: occasional  . Drug use: No  . Sexual activity: Not Currently    Birth control/protection: None  Lifestyle  . Physical activity:    Days per week: Not on file    Minutes per session: Not on file  . Stress: Not on file  Relationships  . Social connections:    Talks on phone: Not on file    Gets together: Not on file    Attends religious service: Not on file  Active member of club or organization: Not on file    Attends meetings of clubs or organizations: Not on file    Relationship status: Not on file  . Intimate partner violence:    Fear of current or ex partner: Not on file    Emotionally abused: Not on file    Physically abused: Not on file    Forced sexual activity: Not on file  Other Topics Concern  . Not on file  Social History Narrative  . Not on file     PHYSICAL EXAM:  VS: BP 110/70 (BP Location: Right Arm, Patient Position: Sitting, Cuff Size: Normal)   Pulse 74   Temp 98.6 F (37 C) (Oral)   Ht 5\' 7"  (1.702 m)   Wt 186 lb (84.4 kg)   SpO2 99%   BMI 29.13 kg/m  Physical Exam Gen: NAD, alert, cooperative with exam, well-appearing ENT: normal lips, normal nasal mucosa,  Eye: normal EOM, normal conjunctiva and lids CV:  no edema, +2 pedal pulses   Resp: no accessory muscle use,  non-labored,  Skin: no rashes, no areas of induration  Neuro: normal tone, normal sensation to touch Psych:  normal insight, alert and oriented MSK:  Left and right knee: No effusion. No tenderness to palpation of the medial or lateral joint line. Normal range of motion. Normal strength resistance. No instability with valgus and varus stress testing. Negative McMurray's test. No pain with patellar grind or compression. Neurovascularly intact     ASSESSMENT & PLAN:   Bilateral chronic knee pain Pain seems to be consistent with patellofemoral's pain.  Previous x-ray did not demonstrate any significant degenerative changes in the right knee.  History would suggest patellofemoral syndrome as well. -X-ray. -Samples of Vimovo and Pennsaid provided. -If no improvement will consider an injection

## 2018-01-05 ENCOUNTER — Telehealth: Payer: Self-pay | Admitting: Family Medicine

## 2018-01-05 NOTE — Telephone Encounter (Signed)
Left VM for patient. If she calls back please have her speak with a nurse/CMA and inform that her xrays doesn't show any significant changes of the knee. The [facility] can report results to patient.   If any questions then please take the best time and phone number to call and I will try to call her back.   Myra Rude, MD [facility] 01/05/2018, 9:59 AM

## 2018-03-29 ENCOUNTER — Ambulatory Visit (INDEPENDENT_AMBULATORY_CARE_PROVIDER_SITE_OTHER): Payer: Medicare HMO | Admitting: Family

## 2018-03-29 ENCOUNTER — Other Ambulatory Visit (INDEPENDENT_AMBULATORY_CARE_PROVIDER_SITE_OTHER): Payer: Medicare HMO

## 2018-03-29 ENCOUNTER — Encounter: Payer: Self-pay | Admitting: Family

## 2018-03-29 VITALS — BP 120/72 | HR 68 | Temp 98.5°F | Ht 67.0 in | Wt 187.0 lb

## 2018-03-29 DIAGNOSIS — J324 Chronic pansinusitis: Secondary | ICD-10-CM

## 2018-03-29 DIAGNOSIS — Z720 Tobacco use: Secondary | ICD-10-CM | POA: Diagnosis not present

## 2018-03-29 DIAGNOSIS — R7303 Prediabetes: Secondary | ICD-10-CM

## 2018-03-29 DIAGNOSIS — I10 Essential (primary) hypertension: Secondary | ICD-10-CM | POA: Diagnosis not present

## 2018-03-29 DIAGNOSIS — N3941 Urge incontinence: Secondary | ICD-10-CM

## 2018-03-29 DIAGNOSIS — E785 Hyperlipidemia, unspecified: Secondary | ICD-10-CM | POA: Diagnosis not present

## 2018-03-29 DIAGNOSIS — E559 Vitamin D deficiency, unspecified: Secondary | ICD-10-CM

## 2018-03-29 LAB — CBC WITH DIFFERENTIAL/PLATELET
Basophils Absolute: 0 10*3/uL (ref 0.0–0.1)
Basophils Relative: 0.4 % (ref 0.0–3.0)
EOS PCT: 1.1 % (ref 0.0–5.0)
Eosinophils Absolute: 0.1 10*3/uL (ref 0.0–0.7)
HCT: 43.7 % (ref 36.0–46.0)
Hemoglobin: 14.5 g/dL (ref 12.0–15.0)
Lymphocytes Relative: 40.2 % (ref 12.0–46.0)
Lymphs Abs: 2.6 10*3/uL (ref 0.7–4.0)
MCHC: 33.2 g/dL (ref 30.0–36.0)
MCV: 92.2 fl (ref 78.0–100.0)
Monocytes Absolute: 0.3 10*3/uL (ref 0.1–1.0)
Monocytes Relative: 5.3 % (ref 3.0–12.0)
Neutro Abs: 3.5 10*3/uL (ref 1.4–7.7)
Neutrophils Relative %: 53 % (ref 43.0–77.0)
Platelets: 226 10*3/uL (ref 150.0–400.0)
RBC: 4.74 Mil/uL (ref 3.87–5.11)
RDW: 13.9 % (ref 11.5–15.5)
WBC: 6.6 10*3/uL (ref 4.0–10.5)

## 2018-03-29 LAB — COMPREHENSIVE METABOLIC PANEL
ALT: 11 U/L (ref 0–35)
AST: 11 U/L (ref 0–37)
Albumin: 3.9 g/dL (ref 3.5–5.2)
Alkaline Phosphatase: 74 U/L (ref 39–117)
BUN: 11 mg/dL (ref 6–23)
CO2: 30 mEq/L (ref 19–32)
Calcium: 9.1 mg/dL (ref 8.4–10.5)
Chloride: 105 mEq/L (ref 96–112)
Creatinine, Ser: 0.57 mg/dL (ref 0.40–1.20)
GFR: 130.19 mL/min (ref >60.00)
Glucose, Bld: 104 mg/dL — ABNORMAL HIGH (ref 70–99)
Potassium: 4.2 mEq/L (ref 3.5–5.1)
Sodium: 141 mEq/L (ref 135–145)
Total Bilirubin: 0.7 mg/dL (ref 0.2–1.2)
Total Protein: 6.1 g/dL (ref 6.0–8.3)

## 2018-03-29 LAB — LIPID PANEL
Cholesterol: 181 mg/dL (ref 0–200)
HDL: 48.6 mg/dL (ref >39.00)
LDL Cholesterol: 101 mg/dL — ABNORMAL HIGH (ref 0–99)
NONHDL: 132.19
Total CHOL/HDL Ratio: 4
Triglycerides: 158 mg/dL — ABNORMAL HIGH (ref 0.0–149.0)
VLDL: 31.6 mg/dL (ref 0.0–40.0)

## 2018-03-29 LAB — HEMOGLOBIN A1C: Hgb A1c MFr Bld: 5.8 % (ref 4.6–6.5)

## 2018-03-29 LAB — VITAMIN D 25 HYDROXY (VIT D DEFICIENCY, FRACTURES): VITD: 7.48 ng/mL — ABNORMAL LOW (ref 30.00–100.00)

## 2018-03-29 MED ORDER — FLUTICASONE PROPIONATE 50 MCG/ACT NA SUSP: 2.0000 | Freq: Every day | NASAL | 3 refills | Status: DC

## 2018-03-29 MED ORDER — ATORVASTATIN CALCIUM 40 MG PO TABS: 40.0000 mg | ORAL_TABLET | Freq: Every day | ORAL | 3 refills | Status: DC

## 2018-03-29 MED ORDER — LISINOPRIL-HYDROCHLOROTHIAZIDE 20-25 MG PO TABS: 1.0000 | ORAL_TABLET | Freq: Every day | ORAL | 3 refills | Status: DC

## 2018-03-29 NOTE — Progress Notes (Signed)
Anne Palmer is a 63 y.o. female with the following history as recorded in Epic[employer]:  Patient Active Problem List   Diagnosis Date Noted  . 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 Hyperlipidemia 12/09/2016  . Osteopenia 12/09/2016  . Upper back pain, chronic 12/09/2016  . Chronic sinusitis 06/05/2016  . HTN (hypertension) 03/07/2016  . Headache around the eyes 03/07/2016  . Bilateral chronic knee pain 03/07/2016  . Postmenopausal state 03/07/2016  . Hyperglycemia 03/07/2016  . Tobacco use disorder 03/07/2016    Current Outpatient Medications  Medication Sig Dispense Refill  . albuterol (PROVENTIL HFA;VENTOLIN HFA) 108 (90 Base) MCG/ACT inhaler Inhale 2 puffs into the lungs every 6 (six) hours as needed for wheezing or shortness of breath. 1 Inhaler 2  . atorvastatin (LIPITOR) 40 MG tablet Take 1 tablet (40 mg total) by mouth daily at 6 PM. 90 tablet 3  . calcium carbonate (CALCIUM 600) 600 MG TABS tablet Take 1 tablet (600 mg total) by mouth 2 (two) times daily with a meal. 60 tablet   . Diclofenac Sodium (PENNSAID) 2 % SOLN Place 1 application onto the skin 2 (two) times daily. 1 Bottle 3  . fluticasone (FLONASE) 50 MCG/ACT nasal spray Place 2 sprays into both nostrils daily. 48 g 3  . lisinopril-hydrochlorothiazide (PRINZIDE,ZESTORETIC) 20-25 MG tablet Take 1 tablet by mouth daily. 90 tablet 3  . mirabegron ER (MYRBETRIQ) 25 MG TB24 tablet Take 1 tablet (25 mg total) by mouth daily. 30 tablet   . venlafaxine XR (EFFEXOR-XR) 75 MG 24 hr capsule Take 1 capsule (75 mg total) by mouth daily with breakfast. 90 capsule 1   No current facility-administered medications for this visit.     Allergies: Sulfa antibiotics  Past Medical History:  Diagnosis Date  . Arthritis   . Diverticulitis 2015  . Hematuria   . Hypertension     Past Surgical History:  Procedure Laterality Date  . ABDOMINAL HYSTERECTOMY     secondary to endometriosis and fibroids  . COLONOSCOPY  2015   no polyps,  diverticulosis  . SHOULDER SURGERY  2008   left    Family History  Problem Relation Age of Onset  . Arthritis Mother   . Stroke Mother   . Hypertension Mother   . Diabetes Mother   . Alcohol abuse Father   . Arthritis Sister   . Hyperlipidemia Sister   . Diabetes Sister   . Lupus Sister   . Arthritis Brother   . Diabetes Brother     Social History   Tobacco Use  . Smoking status: Current Every Day Smoker    Packs/day: 0.25  . Smokeless tobacco: Never Used  Substance Use Topics  . Alcohol use: Yes    Comment: occasional    Subjective:  Patient presents for 3 month follow-up on chronic care needs; in baseline state of health today; Denies any chest pain, shortness of breath, blurred vision or headache Doing well on Effexor XR, Myrbetriq; has continued to take her Lipitor as well- did not understand to try holding that after last office visit; Starting to exercise more regularly- riding stationary bike 5 days/ week;   Objective:  Vitals:   03/29/18 1105  BP: 120/72  Pulse: 68  Temp: 98.5 F (36.9 C)  TempSrc: Oral  SpO2: 98%  Weight: 187 lb (84.8 kg)  Height: _0  (1.702 m)    General: Well developed, well nourished, in no acute distress  Skin : Warm and dry.  Head: Normocephalic and atraumatic  Eyes: Sclera and conjunctiva clear; pupils round and reactive to light; extraocular movements intact  Ears: External normal; canals clear; tympanic membranes normal  Oropharynx: Pink, supple. No suspicious lesions  Neck: Supple without thyromegaly, adenopathy  Lungs: Respirations unlabored; clear to auscultation bilaterally without wheeze, rales, rhonchi  CVS exam: normal rate and regular rhythm.  Neurologic: Alert and oriented; speech intact; face symmetrical; moves all extremities well; CNII-XII intact without focal deficit  Assessment:  1. Hyperlipidemia, unspecified hyperlipidemia type   2. Chronic pansinusitis   3. Pre-diabetes   4. Vitamin D deficiency   5.  Essential hypertension   6. Tobacco abuse   7. Urge incontinence of urine     Plan:  1. Check lipid panel, CMP; refill on Atoravastatin; 2. Refill on Flonase; 3. Check Hgba1c; 4. Check Vitamin D level today; 5. Stable; continue same medication; refill updated; 6. Stressed need to quit smoking- patient defers; refer for lung cancer screen CT; 7. Samples of Myrbetriq 25 mg qd- use as needed.     No follow-ups on file.  Orders Placed This Encounter  Procedures  . CBC w/Diff    Standing Status:   Future    Number of Occurrences:   1    Standing Expiration Date:   03/29/2019  . Comp Met (CMET)    Standing Status:   Future    Number of Occurrences:   1    Standing Expiration Date:   03/29/2019  . Lipid panel    Standing Status:   Future    Number of Occurrences:   1    Standing Expiration Date:   03/30/2019  . HgB A1c    Standing Status:   Future    Number of Occurrences:   1    Standing Expiration Date:   03/29/2019  . Vitamin D (25 hydroxy)    Standing Status:   Future    Number of Occurrences:   1    Standing Expiration Date:   03/29/2019  . Ambulatory Referral for Lung Cancer Scre    Referral Priority:   Routine    Referral Type:   Consultation    Referral Reason:   Specialty Services Required    Number of Visits Requested:   1    Requested Prescriptions   Signed Prescriptions Disp Refills  . atorvastatin (LIPITOR) 40 MG tablet 90 tablet 3    Sig: Take 1 tablet (40 mg total) by mouth daily at 6 PM.  . fluticasone (FLONASE) 50 MCG/ACT nasal spray 48 g 3    Sig: Place 2 sprays into both nostrils daily.  Marland Kitchen lisinopril-hydrochlorothiazide (PRINZIDE,ZESTORETIC) 20-25 MG tablet 90 tablet 3    Sig: Take 1 tablet by mouth daily.

## 2018-03-30 ENCOUNTER — Other Ambulatory Visit: Payer: Self-pay | Admitting: Family

## 2018-03-30 MED ORDER — VITAMIN D (ERGOCALCIFEROL) 1.25 MG (50000 UNIT) PO CAPS: 50000.0000 [IU] | ORAL_CAPSULE | ORAL | 0 refills | Status: AC

## 2018-04-08 ENCOUNTER — Encounter: Payer: Self-pay | Admitting: Family

## 2018-04-12 ENCOUNTER — Telehealth: Payer: Self-pay | Admitting: *Deleted

## 2018-04-13 NOTE — Telephone Encounter (Signed)
Pt is returning call CB# [phone]//kob

## 2018-04-13 NOTE — Telephone Encounter (Signed)
ATC - Busy Signal - Unable to leave message  - Will call back

## 2018-04-13 NOTE — Telephone Encounter (Signed)
ATC - Busy signal - Unable to leave message - Will call back

## 2018-04-15 NOTE — Telephone Encounter (Signed)
ATC - busy signal - No other number listed to call - If pt doesn't return call I will send letter to pt.

## 2018-04-19 ENCOUNTER — Encounter: Payer: Self-pay | Admitting: *Deleted

## 2018-04-19 NOTE — Telephone Encounter (Signed)
ATC - Still received a busy signal - Letter mailed to pt to have her contact office and leave another contact number.  Will close this message and refer to referral notes.

## 2018-04-23 ENCOUNTER — Telehealth: Payer: Self-pay

## 2018-04-26 NOTE — Telephone Encounter (Signed)
Spoke with pt regarding lung cancer screening.  Pt advised that she will not qualify due to less than 30 pack year smoking history.  Letter sent to Dr Dayton Scrape to make her aware referral has been cancelled. Nothing further needed

## 2018-07-09 ENCOUNTER — Encounter: Payer: Self-pay | Admitting: Family

## 2018-07-09 ENCOUNTER — Telehealth: Payer: Self-pay | Admitting: Family Medicine

## 2018-07-09 NOTE — Telephone Encounter (Signed)
Left patient vm to call back to schedule with Dr. Jordan Likes.

## 2018-07-09 NOTE — Telephone Encounter (Signed)
Pt scheduled  

## 2018-07-14 ENCOUNTER — Encounter: Payer: Self-pay | Admitting: Family

## 2018-07-14 ENCOUNTER — Other Ambulatory Visit: Payer: Self-pay | Admitting: Family

## 2018-07-14 MED ORDER — LISINOPRIL-HYDROCHLOROTHIAZIDE 20-25 MG PO TABS: 1.0000 | ORAL_TABLET | Freq: Every day | ORAL | 0 refills | Status: DC

## 2018-07-14 NOTE — Progress Notes (Signed)
error 

## 2018-07-16 ENCOUNTER — Other Ambulatory Visit: Payer: Self-pay

## 2018-07-16 ENCOUNTER — Ambulatory Visit (INDEPENDENT_AMBULATORY_CARE_PROVIDER_SITE_OTHER): Payer: Medicare Other | Admitting: Family Medicine

## 2018-07-16 ENCOUNTER — Ambulatory Visit: Payer: Self-pay

## 2018-07-16 ENCOUNTER — Encounter: Payer: Self-pay | Admitting: Family Medicine

## 2018-07-16 VITALS — BP 110/64 | HR 78 | Resp 16 | Ht 67.0 in | Wt 199.0 lb

## 2018-07-16 DIAGNOSIS — M170 Bilateral primary osteoarthritis of knee: Secondary | ICD-10-CM

## 2018-07-16 NOTE — Progress Notes (Signed)
Anne Palmer - 63 y.o. female MRN [id number]  Date of birth: 11/03/1955  SUBJECTIVE:  Including CC & ROS.  No chief complaint on file.   Anne Palmer is a 62 y.o. female that is presenting with acute on chronic left knee pain and acute right knee pain.  The right knee pain started about a month and a half ago.  The left knee has been ongoing since November.  She denies inciting event or trauma.  The pain is occurring over the medial joint line.  The pain has gotten worse as of late in each knee.  She is not had any improvement with home modalities or medications.  Denies any mechanical symptoms.  No prior history of surgery.  The pain is mild to severe..  Independent review of the left knee x-ray from 11/4 shows mild medial joint space narrowing of the left knee.   Review of Systems  Constitutional: Negative for fever.  HENT: Negative for congestion.   Respiratory: Negative for cough.   Cardiovascular: Negative for chest pain.  Gastrointestinal: Negative for abdominal distention.  Musculoskeletal: Positive for arthralgias and gait problem.  Skin: Negative for color change.  Neurological: Negative for weakness.  Hematological: Negative for adenopathy.    HISTORY: Past Medical, Surgical, Social, and Family History Reviewed & Updated per EMR.   Pertinent Historical Findings include:  Past Medical History:  Diagnosis Date  . Arthritis   . Diverticulitis 2015  . Hematuria   . Hypertension     Past Surgical History:  Procedure Laterality Date  . ABDOMINAL HYSTERECTOMY     secondary to endometriosis and fibroids  . COLONOSCOPY  2015   no polyps, diverticulosis  . SHOULDER SURGERY  2008   left    Allergies  Allergen Reactions  . Sulfa Antibiotics Hives    Family History  Problem Relation Age of Onset  . Arthritis Mother   . Stroke Mother   . Hypertension Mother   . Diabetes Mother   . Alcohol abuse Father   . Arthritis Sister   . Hyperlipidemia Sister   .  Diabetes Sister   . Lupus Sister   . Arthritis Brother   . Diabetes Brother      Social History   Socioeconomic History  . Marital status: Single    Spouse name: Not on file  . Number of children: Not on file  . Years of education: Not on file  . Highest education level: Not on file  Occupational History  . Not on file  Social Needs  . Financial resource strain: Not on file  . Food insecurity:    Worry: Not on file    Inability: Not on file  . Transportation needs:    Medical: Not on file    Non-medical: Not on file  Tobacco Use  . Smoking status: Current Every Day Smoker    Packs/day: 0.25  . Smokeless tobacco: Never Used  Substance and Sexual Activity  . Alcohol use: Yes    Comment: occasional  . Drug use: No  . Sexual activity: Not Currently    Birth control/protection: None  Lifestyle  . Physical activity:    Days per week: Not on file    Minutes per session: Not on file  . Stress: Not on file  Relationships  . Social connections:    Talks on phone: Not on file    Gets together: Not on file    Attends religious service: Not on file    Active  member of club or organization: Not on file    Attends meetings of clubs or organizations: Not on file    Relationship status: Not on file  . Intimate partner violence:    Fear of current or ex partner: Not on file    Emotionally abused: Not on file    Physically abused: Not on file    Forced sexual activity: Not on file  Other Topics Concern  . Not on file  Social History Narrative  . Not on file     PHYSICAL EXAM:  VS: BP 110/64   Pulse 78   Resp 16   Ht 5\' 7"  (1.702 m)   Wt 199 lb (90.3 kg)   SpO2 98%   BMI 31.17 kg/m  Physical Exam Gen: NAD, alert, cooperative with exam, well-appearing ENT: normal lips, normal nasal mucosa,  Eye: normal EOM, normal conjunctiva and lids CV:  no edema, +2 pedal pulses   Resp: no accessory muscle use, non-labored,   Skin: no rashes, no areas of induration  Neuro:  normal tone, normal sensation to touch Psych:  normal insight, alert and oriented MSK:  Right and left Knee: Normal to inspection with no erythema or effusion or obvious bony abnormalities. Palpation normal with no warmth or condyle tenderness. Medial joint line tenderness  ROM full in flexion and extension and lower leg rotation. Ligaments with solid consistent endpoints including  LCL, MCL. Negative Mcmurray's tests. Some pain with patellar compression. Patellar glide without crepitus. Patellar and quadriceps tendons unremarkable. Hamstring and quadriceps strength is normal.  Neurovascularly intact     Aspiration/Injection Procedure Note Anne Palmer [birth year]  Procedure: Injection Indications: Right knee pain  Procedure Details Consent: Risks of procedure as well as the alternatives and risks of each were explained to the (patient/caregiver).  Consent for procedure obtained. Time Out: Verified patient identification, verified procedure, site/side was marked, verified correct patient position, special equipment/implants available, medications/allergies/relevent history reviewed, required imaging and test results available.  Performed.  The area was cleaned with iodine and alcohol swabs.    The right knee superior lateral suprapatellar pouch was injected using 1 cc's of 40 mg Kenalog and 4 cc's of 0.5 % bupivacaine with a 21 2" needle.  Ultrasound was used. Images were obtained in long views showing the injection.     A sterile dressing was applied.  Patient did tolerate procedure well.   Aspiration/Injection Procedure Note Anne Palmer December 17, 1955  Procedure: Injection Indications: Left knee pain  Procedure Details Consent: Risks of procedure as well as the alternatives and risks of each were explained to the (patient/caregiver).  Consent for procedure obtained. Time Out: Verified patient identification, verified procedure, site/side was marked, verified correct  patient position, special equipment/implants available, medications/allergies/relevent history reviewed, required imaging and test results available.  Performed.  The area was cleaned with iodine and alcohol swabs.    The left knee superior lateral suprapatellar pouch was injected using 1 cc's of 40 mg Kenalog and 4 cc's of 0.5 % bupivacaine with a 21 2" needle.  Ultrasound was used. Images were obtained in long views showing the injection.     A sterile dressing was applied.  Patient did tolerate procedure well.     ASSESSMENT & PLAN:   OA (osteoarthritis) of knee Likely has a component of degenerative changes that is contributing to her pain.  Also likely has some patellofemoral syndrome. -Bilateral injections today. -Counseled on home exercise therapy and supportive care. -If no improvement can consider physical  therapy or gel injections

## 2018-07-16 NOTE — Patient Instructions (Signed)
Good to see you Take tylenol 650 mg three times a day is the best evidence based medicine we have for arthritis.  Glucosamine sulfate 750mg  twice a day is a supplement that has been shown to help moderate to severe arthritis. Vitamin D 2000 IU daily Fish oil 2 grams daily.  Tumeric 500mg  twice daily.  Capsaicin topically up to four times a day may also help with pain.    Please send me a message in MyChart with any questions or updates.  Please see me back in 4 weeks.   --Dr. Jordan Likes

## 2018-07-16 NOTE — Assessment & Plan Note (Signed)
Likely has a component of degenerative changes that is contributing to her pain.  Also likely has some patellofemoral syndrome. -Bilateral injections today. -Counseled on home exercise therapy and supportive care. -If no improvement can consider physical therapy or gel injections

## 2018-08-27 ENCOUNTER — Other Ambulatory Visit: Payer: Self-pay | Admitting: Family

## 2018-08-27 DIAGNOSIS — Z1231 Encounter for screening mammogram for malignant neoplasm of breast: Secondary | ICD-10-CM

## 2018-08-30 ENCOUNTER — Encounter: Payer: Self-pay | Admitting: Family Medicine

## 2018-08-30 ENCOUNTER — Ambulatory Visit (INDEPENDENT_AMBULATORY_CARE_PROVIDER_SITE_OTHER): Payer: Medicare Other | Admitting: Family Medicine

## 2018-08-30 VITALS — BP 130/70 | HR 85 | Temp 98.9°F | Ht 67.0 in | Wt 198.2 lb

## 2018-08-30 DIAGNOSIS — M170 Bilateral primary osteoarthritis of knee: Secondary | ICD-10-CM

## 2018-08-30 MED ORDER — PREDNISONE 5 MG PO TABS: ORAL_TABLET | ORAL | 0 refills | Status: DC

## 2018-08-30 NOTE — Progress Notes (Signed)
Anne Palmer - 63 y.o. female MRN [id number]  Date of birth: 08-Nov-1955  SUBJECTIVE:  Including CC & ROS.  Chief Complaint  Patient presents with  . Pain    left knee pain    Anne Palmer is a 63 y.o. female that is presenting with left knee pain. the pain is acute on chronic in nature. The pain is sharp and throbbing. She had mild improvement with previous injection. Pain is severe and intermittent but becoming more consistent. Denies an inciting event or trauma. Feels like the pain is getting worse.  Independent review of the left knee xray from 11/4 shows mild degenerative changes.   Review of Systems  HISTORY: Past Medical, Surgical, Social, and Family History Reviewed & Updated per EMR.   Pertinent Historical Findings include:  Past Medical History:  Diagnosis Date  . Arthritis   . Diverticulitis 2015  . Hematuria   . Hypertension     Past Surgical History:  Procedure Laterality Date  . ABDOMINAL HYSTERECTOMY     secondary to endometriosis and fibroids  . COLONOSCOPY  2015   no polyps, diverticulosis  . SHOULDER SURGERY  2008   left    Allergies  Allergen Reactions  . Sulfa Antibiotics Hives    Family History  Problem Relation Age of Onset  . Arthritis Mother   . Stroke Mother   . Hypertension Mother   . Diabetes Mother   . Alcohol abuse Father   . Arthritis Sister   . Hyperlipidemia Sister   . Diabetes Sister   . Lupus Sister   . Arthritis Brother   . Diabetes Brother      Social History   Socioeconomic History  . Marital status: Single    Spouse name: Not on file  . Number of children: Not on file  . Years of education: Not on file  . Highest education level: Not on file  Occupational History  . Not on file  Social Needs  . Financial resource strain: Not on file  . Food insecurity    Worry: Not on file    Inability: Not on file  . Transportation needs    Medical: Not on file    Non-medical: Not on file  Tobacco Use  . Smoking  status: Current Every Day Smoker    Packs/day: 0.25  . Smokeless tobacco: Never Used  Substance and Sexual Activity  . Alcohol use: Yes    Comment: occasional  . Drug use: No  . Sexual activity: Not Currently    Birth control/protection: None  Lifestyle  . Physical activity    Days per week: Not on file    Minutes per session: Not on file  . Stress: Not on file  Relationships  . Social Herbalist on phone: Not on file    Gets together: Not on file    Attends religious service: Not on file    Active member of club or organization: Not on file    Attends meetings of clubs or organizations: Not on file    Relationship status: Not on file  . Intimate partner violence    Fear of current or ex partner: Not on file    Emotionally abused: Not on file    Physically abused: Not on file    Forced sexual activity: Not on file  Other Topics Concern  . Not on file  Social History Narrative  . Not on file     PHYSICAL EXAM:  VS:  BP 130/70   Pulse 85   Temp 98.9 F (37.2 C)   Ht 5\' 7"  (1.702 m)   Wt 198 lb 3.2 oz (89.9 kg)   SpO2 97%   BMI 31.04 kg/m  Physical Exam Gen: NAD, alert, cooperative with exam, well-appearing ENT: normal lips, normal nasal mucosa,  Eye: normal EOM, normal conjunctiva and lids CV:  no edema, +2 pedal pulses   Resp: no accessory muscle use, non-labored,  GI: no masses or tenderness, no hernia  Skin: no rashes, no areas of induration  Neuro: normal tone, normal sensation to touch Psych:  normal insight, alert and oriented MSK:  Left knee:  No effusion  Normal range of motion  Normal strength to resistance  Instability with valgus and varus stress testing  No pain with patellar grind  Neurovascularly intact       ASSESSMENT & PLAN:   OA (osteoarthritis) of knee Mild improvement with previous injection. No mechanical symptoms  - prednisone  - counseled on HEP and supportive care - will send for medial unloader brace due to thigh  to calf ratio

## 2018-08-30 NOTE — Patient Instructions (Signed)
Good to see you Please try the medicine  Please try ice on the knee I will give the rep for the knee brace your number and he will call you.  Please send me a message in MyChart with any questions or updates.  Please see me back in 4 weeks if no better.   --Dr. Raeford Razor

## 2018-08-30 NOTE — Assessment & Plan Note (Signed)
Mild improvement with previous injection. No mechanical symptoms  - prednisone  - counseled on HEP and supportive care - will send for medial unloader brace due to thigh to calf ratio

## 2018-09-27 ENCOUNTER — Encounter: Payer: Self-pay | Admitting: Family

## 2018-09-27 ENCOUNTER — Ambulatory Visit (INDEPENDENT_AMBULATORY_CARE_PROVIDER_SITE_OTHER): Payer: Medicare Other | Admitting: Family

## 2018-09-27 ENCOUNTER — Other Ambulatory Visit: Payer: Self-pay

## 2018-09-27 ENCOUNTER — Other Ambulatory Visit (INDEPENDENT_AMBULATORY_CARE_PROVIDER_SITE_OTHER): Payer: Medicare Other

## 2018-09-27 VITALS — BP 120/70 | HR 72 | Temp 99.1°F | Ht 67.0 in | Wt 196.1 lb

## 2018-09-27 DIAGNOSIS — E559 Vitamin D deficiency, unspecified: Secondary | ICD-10-CM

## 2018-09-27 DIAGNOSIS — I10 Essential (primary) hypertension: Secondary | ICD-10-CM

## 2018-09-27 DIAGNOSIS — E785 Hyperlipidemia, unspecified: Secondary | ICD-10-CM

## 2018-09-27 DIAGNOSIS — Z780 Asymptomatic menopausal state: Secondary | ICD-10-CM

## 2018-09-27 DIAGNOSIS — J40 Bronchitis, not specified as acute or chronic: Secondary | ICD-10-CM

## 2018-09-27 DIAGNOSIS — M170 Bilateral primary osteoarthritis of knee: Secondary | ICD-10-CM | POA: Insufficient documentation

## 2018-09-27 DIAGNOSIS — J324 Chronic pansinusitis: Secondary | ICD-10-CM

## 2018-09-27 DIAGNOSIS — F17200 Nicotine dependence, unspecified, uncomplicated: Secondary | ICD-10-CM

## 2018-09-27 LAB — COMPREHENSIVE METABOLIC PANEL
ALT: 10 U/L (ref 0–35)
AST: 11 U/L (ref 0–37)
Albumin: 3.9 g/dL (ref 3.5–5.2)
Alkaline Phosphatase: 67 U/L (ref 39–117)
BUN: 8 mg/dL (ref 6–23)
CO2: 28 mEq/L (ref 19–32)
Calcium: 9.1 mg/dL (ref 8.4–10.5)
Chloride: 107 mEq/L (ref 96–112)
Creatinine, Ser: 0.7 mg/dL (ref 0.40–1.20)
GFR: 102.55 mL/min (ref >60.00)
Glucose, Bld: 103 mg/dL — ABNORMAL HIGH (ref 70–99)
Potassium: 4.3 mEq/L (ref 3.5–5.1)
Sodium: 142 mEq/L (ref 135–145)
Total Bilirubin: 0.4 mg/dL (ref 0.2–1.2)
Total Protein: 6.2 g/dL (ref 6.0–8.3)

## 2018-09-27 LAB — LIPID PANEL
Cholesterol: 145 mg/dL (ref 0–200)
HDL: 41.4 mg/dL (ref >39.00)
LDL Cholesterol: 79 mg/dL (ref 0–99)
NonHDL: 103.8
Total CHOL/HDL Ratio: 4
Triglycerides: 123 mg/dL (ref 0.0–149.0)
VLDL: 24.6 mg/dL (ref 0.0–40.0)

## 2018-09-27 LAB — VITAMIN D 25 HYDROXY (VIT D DEFICIENCY, FRACTURES): VITD: 17.89 ng/mL — ABNORMAL LOW (ref 30.00–100.00)

## 2018-09-27 MED ORDER — ALBUTEROL SULFATE HFA 108 (90 BASE) MCG/ACT IN AERS: 2.0000 | INHALATION_SPRAY | Freq: Four times a day (QID) | RESPIRATORY_TRACT | 3 refills | Status: DC | PRN

## 2018-09-27 MED ORDER — FLUTICASONE PROPIONATE 50 MCG/ACT NA SUSP: 2.0000 | Freq: Every day | NASAL | 3 refills | Status: DC

## 2018-09-27 MED ORDER — LISINOPRIL-HYDROCHLOROTHIAZIDE 20-25 MG PO TABS: 1.0000 | ORAL_TABLET | Freq: Every day | ORAL | 0 refills | Status: DC

## 2018-09-27 MED ORDER — HYDROCHLOROTHIAZIDE 12.5 MG PO CAPS: 12.5000 mg | ORAL_CAPSULE | Freq: Every day | ORAL | 0 refills | Status: DC

## 2018-09-27 MED ORDER — VENLAFAXINE HCL ER 75 MG PO CP24: 75.0000 mg | ORAL_CAPSULE | Freq: Every day | ORAL | 3 refills | Status: DC

## 2018-09-27 MED ORDER — LISINOPRIL-HYDROCHLOROTHIAZIDE 20-25 MG PO TABS: 1.0000 | ORAL_TABLET | Freq: Every day | ORAL | 3 refills | Status: DC

## 2018-09-27 NOTE — Progress Notes (Signed)
Anne Palmer is a 63 y.o. female with the following history as recorded in [employer]:  Patient Active Problem List   Diagnosis Date Noted  . Primary osteoarthritis of both knees 09/27/2018  . 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 Hyperlipidemia 12/09/2016  . Osteopenia 12/09/2016  . Upper back pain, chronic 12/09/2016  . Chronic sinusitis 06/05/2016  . Essential hypertension 03/07/2016  . Headache around the eyes 03/07/2016  . OA (osteoarthritis) of knee 03/07/2016  . Postmenopausal state 03/07/2016  . Hyperglycemia 03/07/2016  . Tobacco use disorder 03/07/2016    Current Outpatient Medications  Medication Sig Dispense Refill  . albuterol (VENTOLIN HFA) 108 (90 Base) MCG/ACT inhaler Inhale 2 puffs into the lungs every 6 (six) hours as needed for wheezing or shortness of breath. 18 g 3  . atorvastatin (LIPITOR) 40 MG tablet Take 1 tablet (40 mg total) by mouth daily at 6 PM. 90 tablet 3  . calcium carbonate (CALCIUM 600) 600 MG TABS tablet Take 1 tablet (600 mg total) by mouth 2 (two) times daily with a meal. 60 tablet   . Diclofenac Sodium (PENNSAID) 2 % SOLN Place 1 application onto the skin 2 (two) times daily. 1 Bottle 3  . fluticasone (FLONASE) 50 MCG/ACT nasal spray Place 2 sprays into both nostrils daily. 48 g 3  . lisinopril-hydrochlorothiazide (ZESTORETIC) 20-25 MG tablet Take 1 tablet by mouth daily. 90 tablet 3  . mirabegron ER (MYRBETRIQ) 25 MG TB24 tablet Take 1 tablet (25 mg total) by mouth daily. 30 tablet   . predniSONE (DELTASONE) 5 MG tablet Take 6 pills for first day, 5 pills second day, 4 pills third day, 3 pills fourth day, 2 pills the fifth day, and 1 pill sixth day. 21 tablet 0  . venlafaxine XR (EFFEXOR-XR) 75 MG 24 hr capsule Take 1 capsule (75 mg total) by mouth daily with breakfast. 90 capsule 3  . hydrochlorothiazide (MICROZIDE) 12.5 MG capsule Take 1 capsule (12.5 mg total) by mouth daily. Use as needed for swelling 90 capsule 0   No current  facility-administered medications for this visit.     Allergies: Sulfa antibiotics  Past Medical History:  Diagnosis Date  . Arthritis   . Diverticulitis 2015  . Hematuria   . Hypertension     Past Surgical History:  Procedure Laterality Date  . ABDOMINAL HYSTERECTOMY     secondary to endometriosis and fibroids  . COLONOSCOPY  2015   no polyps, diverticulosis  . SHOULDER SURGERY  2008   left    Family History  Problem Relation Age of Onset  . Arthritis Mother   . Stroke Mother   . Hypertension Mother   . Diabetes Mother   . Alcohol abuse Father   . Arthritis Sister   . Hyperlipidemia Sister   . Diabetes Sister   . Lupus Sister   . Arthritis Brother   . Diabetes Brother     Social History   Tobacco Use  . Smoking status: Current Every Day Smoker    Packs/day: 0.25  . Smokeless tobacco: Never Used  Substance Use Topics  . Alcohol use: Yes    Comment: occasional    Subjective:  6 month follow-up on chronic care needs including: 1) hypertension- Denies any chest pain, shortness of breath, blurred vision or headache. 2) history of hyperlipidemia- not currently taking Lipitor; medication was stopped due to potential side effects; however, patient did not notice any change when stopping the medication; 3) Tobacco abuse- not ready to quit; was not  a candidate for lung cancer screen; 4) bilateral knee pain- needs to get referred to new sports medicine provider; 5) occasional swelling in her right lower leg- feels like she is retaining fluid    Objective:  Vitals:   09/27/18 1103  BP: 120/70  Pulse: 72  Temp: 99.1 F (37.3 C)  TempSrc: Oral  SpO2: 95%  Weight: 196 lb 1.3 oz (88.9 kg)  Height: 5' 7"  (1.702 m)    General: Well developed, well nourished, in no acute distress  Skin : Warm and dry.  Head: Normocephalic and atraumatic  Lungs: Respirations unlabored; clear to auscultation bilaterally without wheeze, rales, rhonchi  CVS exam: normal rate and regular  rhythm.  Musculoskeletal: No deformities; no active joint inflammation  Extremities: trace pedal edema, cyanosis, clubbing  Vessels: Symmetric bilaterally  Neurologic: Alert and oriented; speech intact; face symmetrical; moves all extremities well; CNII-XII intact without focal deficit  Assessment:  1. Hyperlipidemia, unspecified hyperlipidemia type   2. Vitamin D deficiency   3. Chronic pansinusitis   4. Bronchitis   5. Postmenopausal state   6. Primary osteoarthritis of both knees   7. Essential hypertension   8. Tobacco use disorder     Plan:  1. Check lipid panel- will determine need for statin; 2. Re-check Vitamin D level; 3. Refill on Flonase; 4. Refill on albuterol; 5. Stable; refill on Effexor XR; 6. Refer to Dr. Tamala Julian; handicapped sticker updated; 7. Stable; also given HCTZ 12.5 mg to use prn for occasional swelling in addition to blood pressure medication. 8. Not ready to quit; not a candidate for lung cancer screen.   Return for Dr. Tamala Julian for bilateral knee pain.  Orders Placed This Encounter  Procedures  . Comp Met (CMET)    Standing Status:   Future    Number of Occurrences:   1    Standing Expiration Date:   09/27/2019  . Lipid panel    Standing Status:   Future    Number of Occurrences:   1    Standing Expiration Date:   09/27/2019  . Vitamin D (25 hydroxy)    Standing Status:   Future    Number of Occurrences:   1    Standing Expiration Date:   09/27/2019    Requested Prescriptions   Signed Prescriptions Disp Refills  . lisinopril-hydrochlorothiazide (ZESTORETIC) 20-25 MG tablet 90 tablet 3    Sig: Take 1 tablet by mouth daily.  Marland Kitchen albuterol (VENTOLIN HFA) 108 (90 Base) MCG/ACT inhaler 18 g 3    Sig: Inhale 2 puffs into the lungs every 6 (six) hours as needed for wheezing or shortness of breath.  . fluticasone (FLONASE) 50 MCG/ACT nasal spray 48 g 3    Sig: Place 2 sprays into both nostrils daily.  Marland Kitchen venlafaxine XR (EFFEXOR-XR) 75 MG 24 hr capsule 90  capsule 3    Sig: Take 1 capsule (75 mg total) by mouth daily with breakfast.  . hydrochlorothiazide (MICROZIDE) 12.5 MG capsule 90 capsule 0    Sig: Take 1 capsule (12.5 mg total) by mouth daily. Use as needed for swelling

## 2018-09-28 ENCOUNTER — Encounter: Payer: Self-pay | Admitting: Family

## 2018-09-28 ENCOUNTER — Other Ambulatory Visit: Payer: Self-pay | Admitting: Family

## 2018-09-28 DIAGNOSIS — E785 Hyperlipidemia, unspecified: Secondary | ICD-10-CM

## 2018-09-28 MED ORDER — VITAMIN D (ERGOCALCIFEROL) 1.25 MG (50000 UNIT) PO CAPS: 50000.0000 [IU] | ORAL_CAPSULE | ORAL | 0 refills | Status: AC

## 2018-09-28 MED ORDER — ATORVASTATIN CALCIUM 40 MG PO TABS: 40.0000 mg | ORAL_TABLET | Freq: Every day | ORAL | 3 refills | Status: DC

## 2018-10-02 NOTE — Progress Notes (Signed)
Anne Palmer D.O. [facility] [address] [place], [place] [address] Phone: [phone] Subjective:   [name], [name], am serving as a scribe for Dr. Antoine PrimasZachary Palmer.    CC: Knee pain  BJY:NWGNFAOZHYHPI:Subjective   07/16/2018 Likely has a component of degenerative changes that is contributing to her pain.  Also likely has some patellofemoral syndrome. -Bilateral injections today. -Counseled on home exercise therapy and supportive care. -If no improvement can consider physical therapy or gel injections  Update 10/04/2018 Anne Palmer is a 63 y.o. female coming in with complaint of bilateral knee pain. Patient uses custom knee brace on left which is helping. Pain returned 1 week after injection.    Patient did have x-rays of the knee taken November 2019.  Independently visualized by me showing mild arthritic changes laboratory work-up has shown low vitamin D  Past Medical History:  Diagnosis Date  . Arthritis   . Diverticulitis 2015  . Hematuria   . Hypertension    Past Surgical History:  Procedure Laterality Date  . ABDOMINAL HYSTERECTOMY     secondary to endometriosis and fibroids  . COLONOSCOPY  2015   no polyps, diverticulosis  . SHOULDER SURGERY  2008   left   Social History   Socioeconomic History  . Marital status: Single    Spouse name: Not on file  . Number of children: Not on file  . Years of education: Not on file  . Highest education level: Not on file  Occupational History  . Not on file  Social Needs  . Financial resource strain: Not on file  . Food insecurity    Worry: Not on file    Inability: Not on file  . Transportation needs    Medical: Not on file    Non-medical: Not on file  Tobacco Use  . Smoking status: Current Every Day Smoker    Packs/day: 0.25  . Smokeless tobacco: Never Used  Substance and Sexual Activity  . Alcohol use: Yes    Comment: occasional  . Drug use: No  . Sexual activity: Not Currently    Birth control/protection:  None  Lifestyle  . Physical activity    Days per week: Not on file    Minutes per session: Not on file  . Stress: Not on file  Relationships  . Social Musicianconnections    Talks on phone: Not on file    Gets together: Not on file    Attends religious service: Not on file    Active member of club or organization: Not on file    Attends meetings of clubs or organizations: Not on file    Relationship status: Not on file  Other Topics Concern  . Not on file  Social History Narrative  . Not on file   Allergies  Allergen Reactions  . Sulfa Antibiotics Hives   Family History  Problem Relation Age of Onset  . Arthritis Mother   . Stroke Mother   . Hypertension Mother   . Diabetes Mother   . Alcohol abuse Father   . Arthritis Sister   . Hyperlipidemia Sister   . Diabetes Sister   . Lupus Sister   . Arthritis Brother   . Diabetes Brother     Current Outpatient Medications (Endocrine & Metabolic):  .  predniSONE (DELTASONE) 5 MG tablet, Take 6 pills for first day, 5 pills second day, 4 pills third day, 3 pills fourth day, 2 pills the fifth day, and 1 pill sixth day.  Current Outpatient Medications (Cardiovascular):  .  atorvastatin (LIPITOR) 40 MG tablet, Take 1 tablet (40 mg total) by mouth daily at 6 PM. .  hydrochlorothiazide (MICROZIDE) 12.5 MG capsule, Take 1 capsule (12.5 mg total) by mouth daily. Use as needed for swelling .  lisinopril-hydrochlorothiazide (ZESTORETIC) 20-25 MG tablet, Take 1 tablet by mouth daily.  Current Outpatient Medications (Respiratory):  .  albuterol (VENTOLIN HFA) 108 (90 Base) MCG/ACT inhaler, Inhale 2 puffs into the lungs every 6 (six) hours as needed for wheezing or shortness of breath. .  fluticasone (FLONASE) 50 MCG/ACT nasal spray, Place 2 sprays into both nostrils daily.    Current Outpatient Medications (Other):  .  calcium carbonate (CALCIUM 600) 600 MG TABS tablet, Take 1 tablet (600 mg total) by mouth 2 (two) times daily with a meal. .   mirabegron ER (MYRBETRIQ) 25 MG TB24 tablet, Take 1 tablet (25 mg total) by mouth daily. Marland Kitchen  venlafaxine XR (EFFEXOR-XR) 75 MG 24 hr capsule, Take 1 capsule (75 mg total) by mouth daily with breakfast. .  Vitamin D, Ergocalciferol, (DRISDOL) 1.25 MG (50000 UT) CAPS capsule, Take 1 capsule (50,000 Units total) by mouth every 7 (seven) days for 12 doses. .  Diclofenac Sodium (PENNSAID) 2 % SOLN, Place 1 application onto the skin 2 (two) times daily.    Past medical history, social, surgical and family history all reviewed in electronic medical record.  No pertanent information unless stated regarding to the chief complaint.   Review of Systems:  No headache, visual changes, nausea, vomiting, diarrhea, constipation, dizziness, abdominal pain, skin rash, fevers, chills, night sweats, weight loss, swollen lymph nodes, body aches, joint swelling,  chest pain, shortness of breath, mood changes.  Positive muscle aches  Objective  Blood pressure 134/70, pulse 75, height 5\' 7"  (1.702 m), weight 194 lb (88 kg), SpO2 98 %.    General: No apparent distress alert and oriented x3 mood and affect normal, dressed appropriately.  HEENT: Pupils equal, extraocular movements intact  Respiratory: Patient's speak in full sentences and does not appear short of breath  Cardiovascular: Trace lower extremity edema, non tender, no erythema  Skin: Warm dry intact with no signs of infection or rash on extremities or on axial skeleton.  Abdomen: Soft nontender  Neuro: Cranial nerves II through XII are intact, neurovascularly intact in all extremities with 2+ DTRs and 2+ pulses.  Lymph: No lymphadenopathy of posterior or anterior cervical chain or axillae bilaterally.  Gait antalgic MSK:  tender with full range of motion and good stability and symmetric strength and tone of shoulders, elbows, wrist, hip, and ankles bilaterally.  Arthritic changes of multiple joints Knee: Bilateral valgus deformity noted. Large thigh to  calf ratio.  Tender to palpation over medial and PF joint line.  ROM full in flexion and extension and lower leg rotation. instability with valgus force.  painful patellar compression. Patellar glide with moderate crepitus. Patellar and quadriceps tendons unremarkable. Hamstring and quadriceps strength is normal.  After informed written and verbal consent, patient was seated on exam table. Right knee was prepped with alcohol swab and utilizing anterolateral approach, patient's right knee space was injected with 4:1  marcaine 0.5%: Kenalog 40mg /dL. Patient tolerated the procedure well without immediate complications.  After informed written and verbal consent, patient was seated on exam table. Left knee was prepped with alcohol swab and utilizing anterolateral approach, patient's left knee space was injected with 4:1  marcaine 0.5%: Kenalog 40mg /dL. Patient tolerated the procedure well without immediate complications.  Impression and Recommendations:     This case required medical decision making of moderate complexity. The above documentation has been reviewed and is accurate and complete Lyndal Pulley, DO       Note: This dictation was prepared with Dragon dictation along with smaller phrase technology. Any transcriptional errors that result from this process are unintentional.

## 2018-10-04 ENCOUNTER — Other Ambulatory Visit: Payer: Self-pay

## 2018-10-04 ENCOUNTER — Encounter: Payer: Self-pay | Admitting: Family Medicine

## 2018-10-04 ENCOUNTER — Ambulatory Visit (INDEPENDENT_AMBULATORY_CARE_PROVIDER_SITE_OTHER): Payer: Medicare Other | Admitting: Family Medicine

## 2018-10-04 DIAGNOSIS — M170 Bilateral primary osteoarthritis of knee: Secondary | ICD-10-CM

## 2018-10-04 NOTE — Assessment & Plan Note (Signed)
Bilateral steroid injection given today.  We discussed with patient already having the brace for her abnormal thigh to calf ratio and instability.  Discussed the possibility of Visco supplementation will try to get approval.  Discussed icing regimen and home exercises, topical anti-inflammatories.  Follow-up again in 4 to 8 weeks

## 2018-10-04 NOTE — Patient Instructions (Addendum)
Tart cherry extract 1200 mg at night  Ice 20 min 2x a day Continue brace See me in 3 weeks for gel injections

## 2018-10-13 ENCOUNTER — Ambulatory Visit
Admission: RE | Admit: 2018-10-13 | Discharge: 2018-10-13 | Disposition: A | Discharge status: Home / Self Care | Payer: Medicare Other | Source: Ambulatory Visit | Attending: Family | Admitting: Family

## 2018-10-13 ENCOUNTER — Other Ambulatory Visit: Payer: Self-pay

## 2018-10-13 DIAGNOSIS — Z1231 Encounter for screening mammogram for malignant neoplasm of breast: Secondary | ICD-10-CM

## 2018-10-27 ENCOUNTER — Ambulatory Visit (INDEPENDENT_AMBULATORY_CARE_PROVIDER_SITE_OTHER): Payer: Medicare Other | Admitting: Family Medicine

## 2018-10-27 ENCOUNTER — Other Ambulatory Visit: Payer: Self-pay

## 2018-10-27 ENCOUNTER — Encounter: Payer: Self-pay | Admitting: Family Medicine

## 2018-10-27 DIAGNOSIS — M170 Bilateral primary osteoarthritis of knee: Secondary | ICD-10-CM | POA: Diagnosis not present

## 2018-10-27 NOTE — Patient Instructions (Signed)
Good to see you See me again in 6 weeks 

## 2018-10-27 NOTE — Assessment & Plan Note (Signed)
Viscosupplementation given today.  Discussed how this can take 4 to 6 weeks to work thoroughly.  Discussed icing regimen and home exercise, which activities of doing which wants to avoid.  Continue with the bracing.  We discussed that further treatment would include potential surgical intervention.

## 2018-10-27 NOTE — Progress Notes (Signed)
Anne ScaleZach Leory Palmer D.O. [facility] [address] [place], [place] [address] Phone: [phone] Subjective:   I, Anne NighKana Palmer, am serving as a scribe for Dr. Antoine PrimasZachary Skilar Palmer.   CC: Knee pain follow-up  BJY:NWGNFAOZHYHPI:Subjective   10/04/2018 Bilateral steroid injection given today.  We discussed with patient already having the brace for her abnormal thigh to calf ratio and instability.  Discussed the possibility of Visco supplementation will try to get approval.  Discussed icing regimen and home exercises, topical anti-inflammatories.  Follow-up again in 4 to 8 weeks  Update 10/27/2018 Anne Palmer is a 63 y.o. female coming in with complaint of bilateral knee pain. Right knee is doing better. Still some shooting pain in the left knee.  Patient is failed all other conservative therapy but is approved for Visco supplementation.  Trying to wear the brace on the left knee on a more regular basis.      Past Medical History:  Diagnosis Date  . Arthritis   . Diverticulitis 2015  . Hematuria   . Hypertension    Past Surgical History:  Procedure Laterality Date  . ABDOMINAL HYSTERECTOMY     secondary to endometriosis and fibroids  . COLONOSCOPY  2015   no polyps, diverticulosis  . SHOULDER SURGERY  2008   left   Social History   Socioeconomic History  . Marital status: Single    Spouse name: Not on file  . Number of children: Not on file  . Years of education: Not on file  . Highest education level: Not on file  Occupational History  . Not on file  Social Needs  . Financial resource strain: Not on file  . Food insecurity    Worry: Not on file    Inability: Not on file  . Transportation needs    Medical: Not on file    Non-medical: Not on file  Tobacco Use  . Smoking status: Current Every Day Smoker    Packs/day: 0.25  . Smokeless tobacco: Never Used  Substance and Sexual Activity  . Alcohol use: Yes    Comment: occasional  . Drug use: No  . Sexual activity: Not  Currently    Birth control/protection: None  Lifestyle  . Physical activity    Days per week: Not on file    Minutes per session: Not on file  . Stress: Not on file  Relationships  . Social Musicianconnections    Talks on phone: Not on file    Gets together: Not on file    Attends religious service: Not on file    Active member of club or organization: Not on file    Attends meetings of clubs or organizations: Not on file    Relationship status: Not on file  Other Topics Concern  . Not on file  Social History Narrative  . Not on file   Allergies  Allergen Reactions  . Sulfa Antibiotics Hives   Family History  Problem Relation Age of Onset  . Arthritis Mother   . Stroke Mother   . Hypertension Mother   . Diabetes Mother   . Alcohol abuse Father   . Arthritis Sister   . Hyperlipidemia Sister   . Diabetes Sister   . Lupus Sister   . Arthritis Brother   . Diabetes Brother      Current Outpatient Medications (Cardiovascular):  .  atorvastatin (LIPITOR) 40 MG tablet, Take 1 tablet (40 mg total) by mouth daily at 6 PM. .  hydrochlorothiazide (MICROZIDE) 12.5  MG capsule, Take 1 capsule (12.5 mg total) by mouth daily. Use as needed for swelling .  lisinopril-hydrochlorothiazide (ZESTORETIC) 20-25 MG tablet, Take 1 tablet by mouth daily.  Current Outpatient Medications (Respiratory):  .  albuterol (VENTOLIN HFA) 108 (90 Base) MCG/ACT inhaler, Inhale 2 puffs into the lungs every 6 (six) hours as needed for wheezing or shortness of breath. .  fluticasone (FLONASE) 50 MCG/ACT nasal spray, Place 2 sprays into both nostrils daily.    Current Outpatient Medications (Other):  .  calcium carbonate (CALCIUM 600) 600 MG TABS tablet, Take 1 tablet (600 mg total) by mouth 2 (two) times daily with a meal. .  mirabegron ER (MYRBETRIQ) 25 MG TB24 tablet, Take 1 tablet (25 mg total) by mouth daily. Marland Kitchen  venlafaxine XR (EFFEXOR-XR) 75 MG 24 hr capsule, Take 1 capsule (75 mg total) by mouth daily  with breakfast. .  Vitamin D, Ergocalciferol, (DRISDOL) 1.25 MG (50000 UT) CAPS capsule, Take 1 capsule (50,000 Units total) by mouth every 7 (seven) days for 12 doses.    Past medical history, social, surgical and family history all reviewed in electronic medical record.  No pertanent information unless stated regarding to the chief complaint.   Review of Systems:  No headache, visual changes, nausea, vomiting, diarrhea, constipation, dizziness, abdominal pain, skin rash, fevers, chills, night sweats, weight loss, swollen lymph nodes, body aches, joint swelling, muscle aches, chest pain, shortness of breath, mood changes.   Objective  Blood pressure 102/60, pulse 70, height 5\' 7"  (1.702 m), SpO2 98 %.    General: No apparent distress alert and oriented x3 mood and affect normal, dressed appropriately.  HEENT: Pupils equal, extraocular movements intact  Respiratory: Patient's speak in full sentences and does not appear short of breath  Cardiovascular: No lower extremity edema, non tender, no erythema  Skin: Warm dry intact with no signs of infection or rash on extremities or on axial skeleton.  Abdomen: Soft nontender  Neuro: Cranial nerves II through XII are intact, neurovascularly intact in all extremities with 2+ DTRs and 2+ pulses.  Lymph: No lymphadenopathy of posterior or anterior cervical chain or axillae bilaterally.  Gait normal with good balance and coordination.  MSK:  Non tender with full range of motion and good stability and symmetric strength and tone of shoulders, elbows, wrist, hip and ankles bilaterally.  Knee: Bilateral valgus deformity noted. Large thigh to calf ratio.  Tender to palpation over medial and PF joint line.  ROM full in flexion and extension and lower leg rotation. instability with valgus force.  painful patellar compression. Patellar glide with moderate crepitus. Patellar and quadriceps tendons unremarkable. Hamstring and quadriceps strength is  normal.  After informed written and verbal consent, patient was seated on exam table. Right knee was prepped with alcohol swab and utilizing anterolateral approach, patient's right knee space was injected with15 mg/2.5 mL of Durolene (sodium hyaluronate) in a prefilled syringe was injected easily into the knee through a 22-gauge needle..Patient tolerated the procedure well without immediate complications.  After informed written and verbal consent, patient was seated on exam table. Left knee was prepped with alcohol swab and utilizing anterolateral approach, patient's left knee space was injected with15 mg/2.5 mL of Durolene (sodium hyaluronate) in a prefilled syringe was injected easily into the knee through a 22-gauge needle..Patient tolerated the procedure well without immediate complications.    Impression and Recommendations:     This case required medical decision making of moderate complexity. The above documentation has been reviewed and  is accurate and complete Lyndal Pulley, DO       Note: This dictation was prepared with Dragon dictation along with smaller phrase technology. Any transcriptional errors that result from this process are unintentional.

## 2018-11-01 ENCOUNTER — Ambulatory Visit: Payer: Medicare Other | Admitting: Family Medicine

## 2018-12-13 ENCOUNTER — Encounter: Payer: Self-pay | Admitting: Family Medicine

## 2018-12-19 IMAGING — MG DIGITAL SCREENING BILATERAL MAMMOGRAM WITH CAD
4 series · 4 of 4 positions shown · non-contrast
Comparison: Previous exam(s).

CLINICAL DATA: Screening.

EXAM:
DIGITAL SCREENING BILATERAL MAMMOGRAM WITH CAD

[L MLO]
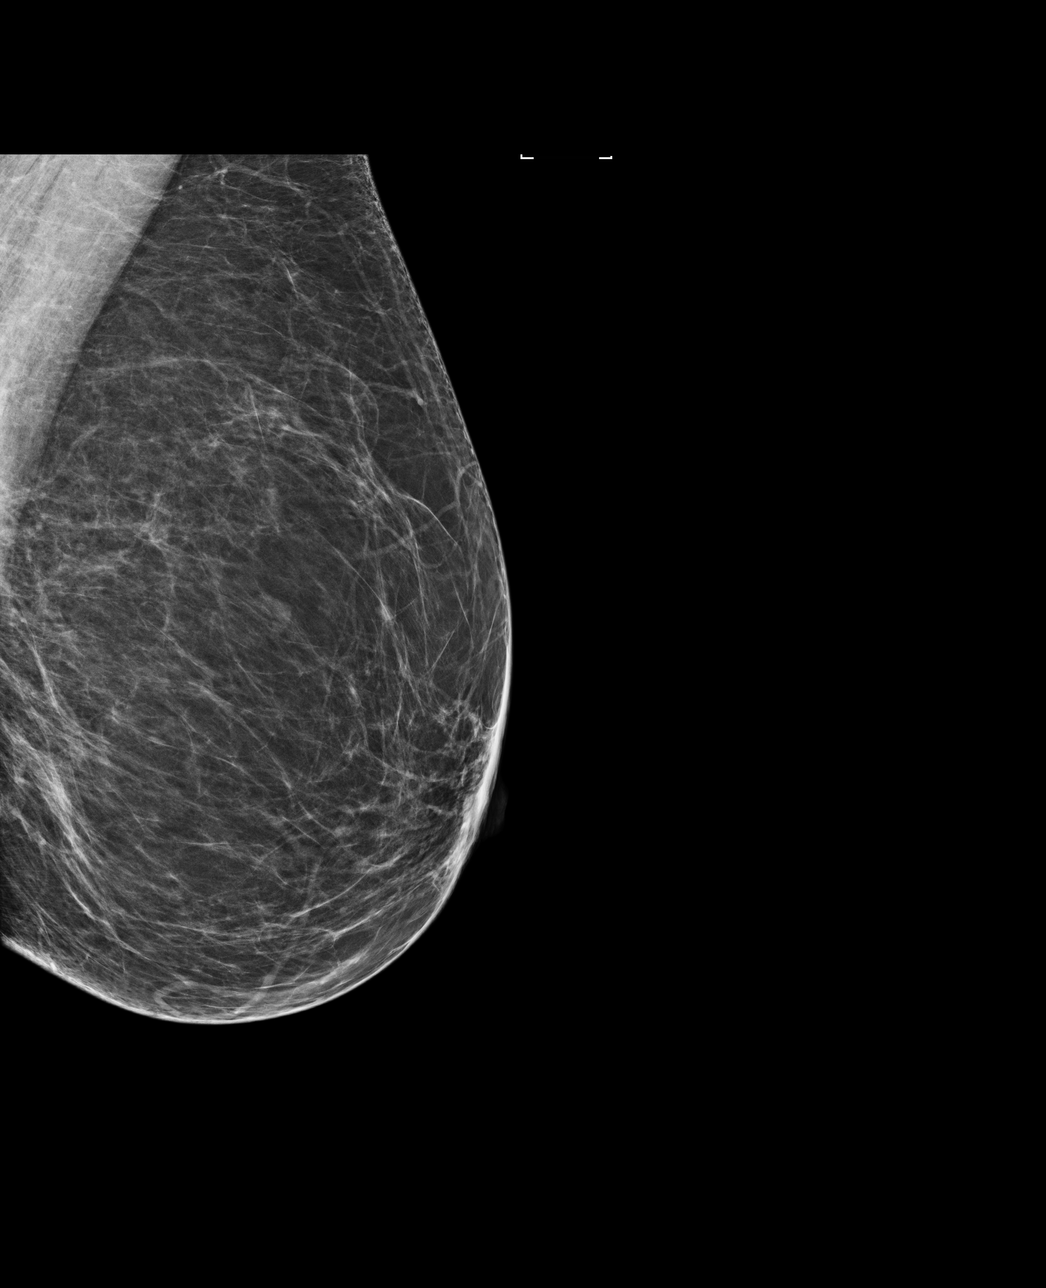

[L CC]
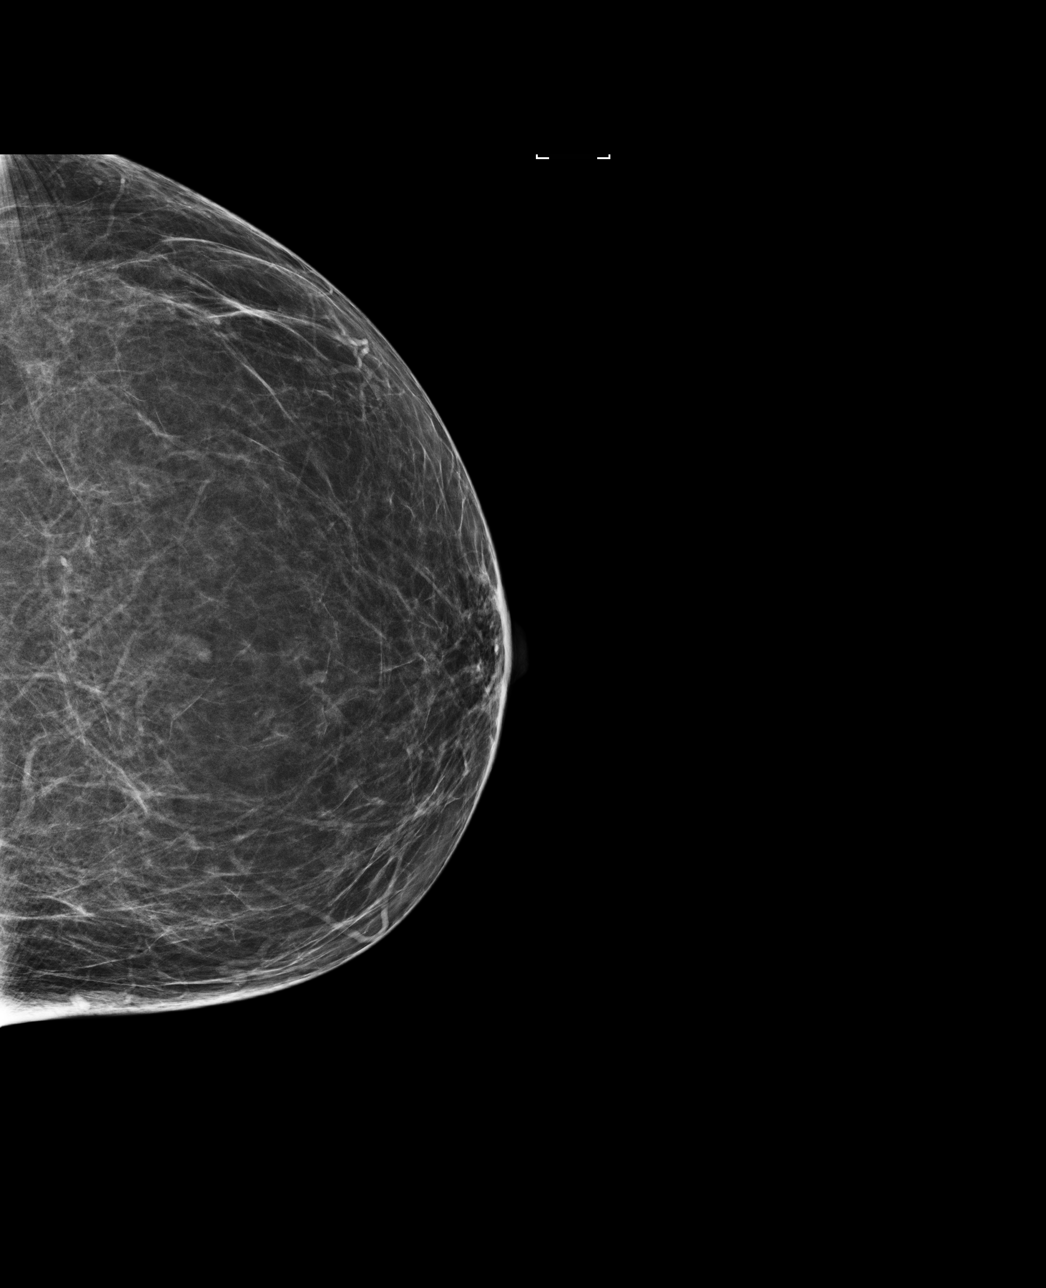

[R CC]
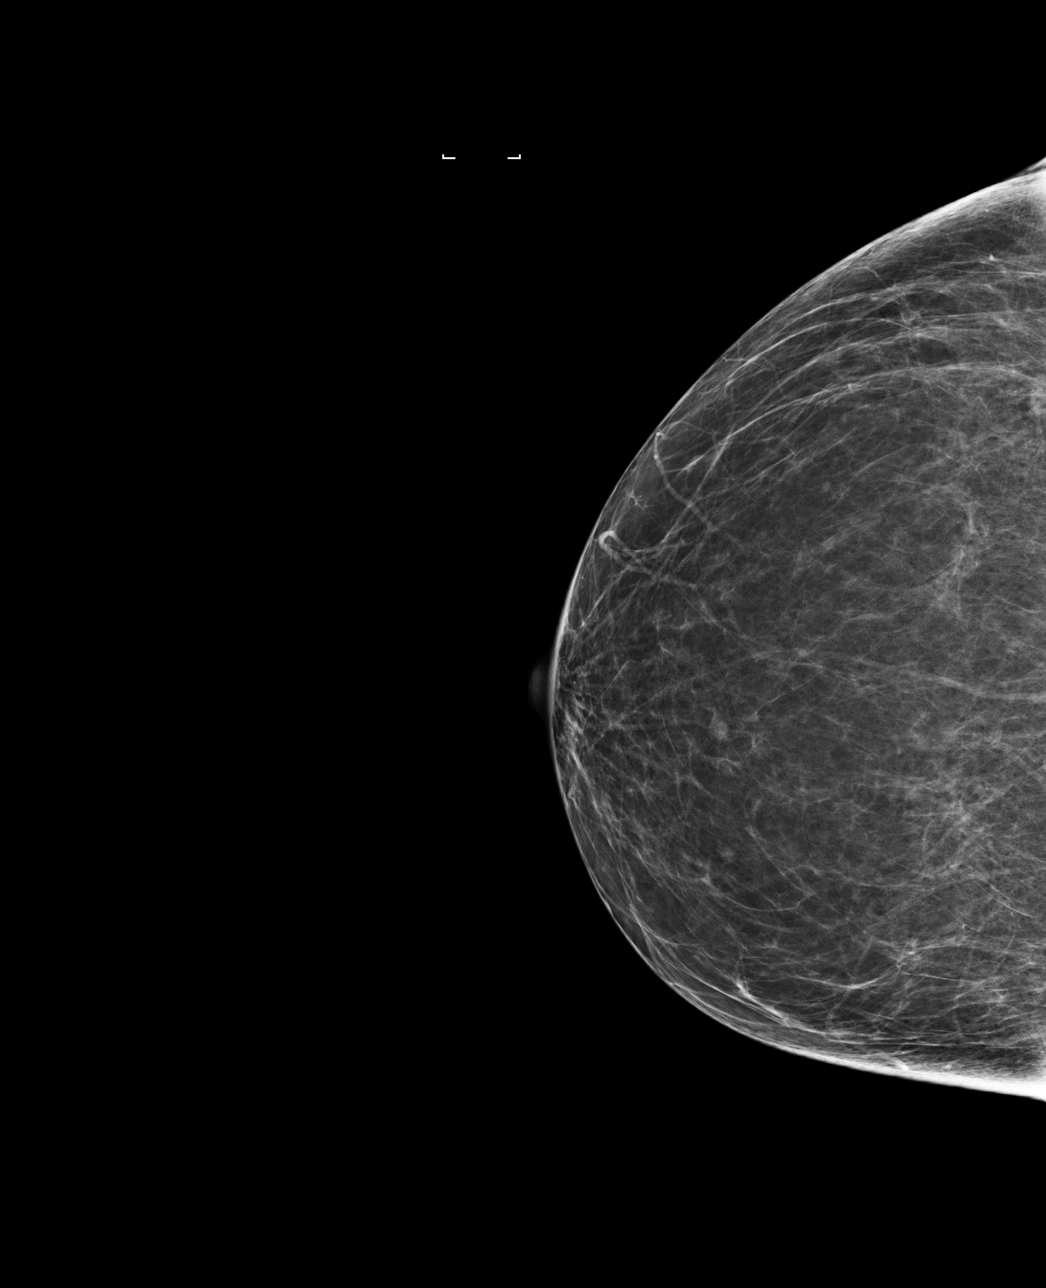

[R MLO]
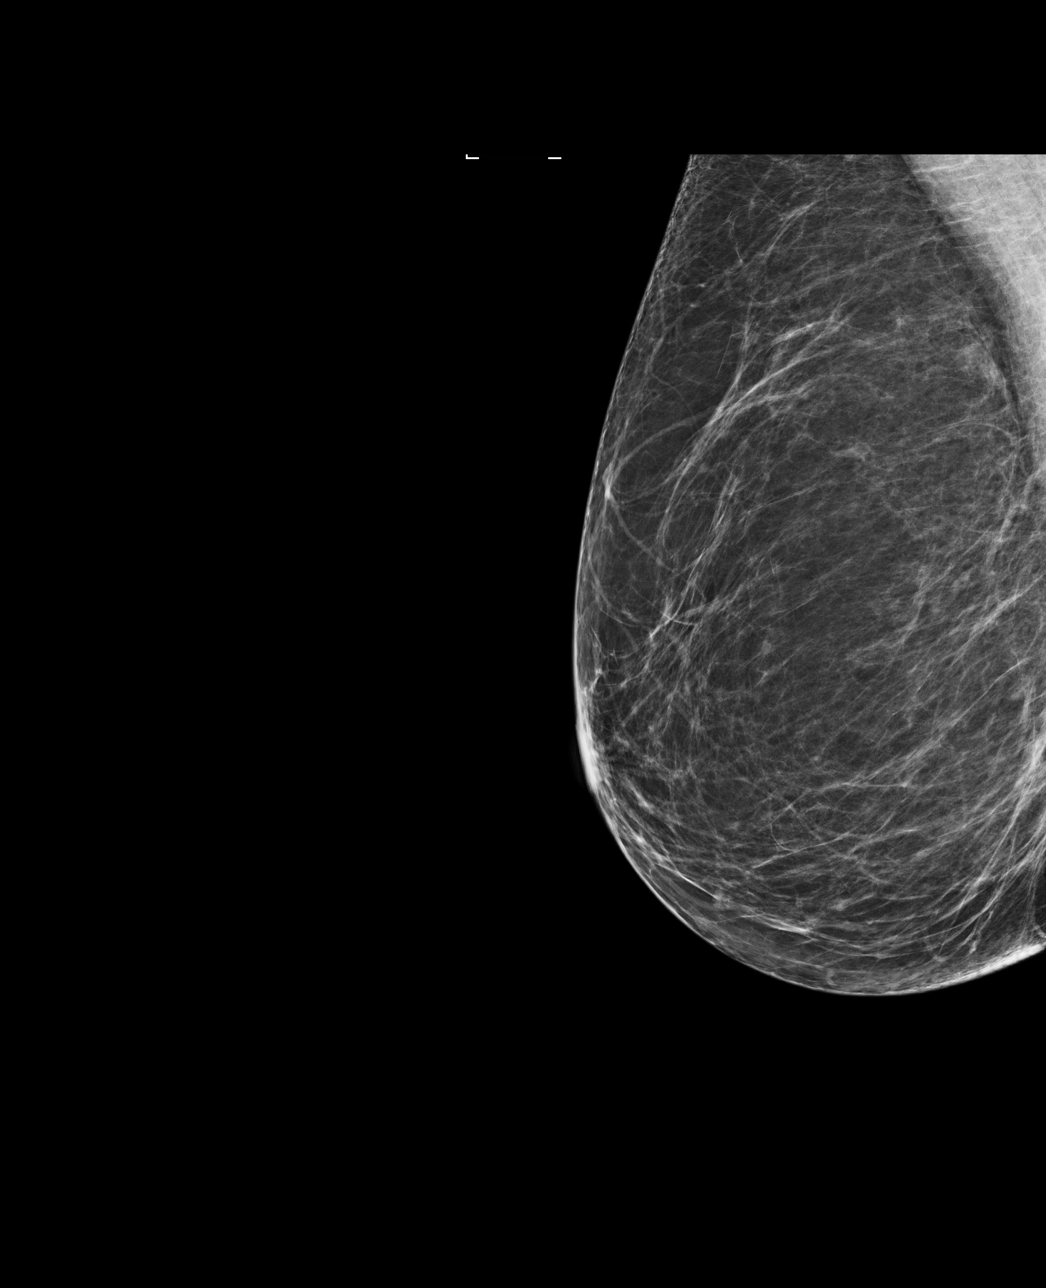

[4 of 4 positions shown; findings below may reference images not displayed]

ACR Breast Density Category b: There are scattered areas of
fibroglandular density.
FINDINGS: There are no findings suspicious for malignancy. Images were
processed with CAD.
IMPRESSION: No mammographic evidence of malignancy. A result letter of this
screening mammogram will be mailed directly to the patient.

RECOMMENDATION:
Screening mammogram in one year. (Code:AS-G-LCT)

BI-RADS CATEGORY  1: Negative.

## 2020-02-28 IMAGING — MG DIGITAL SCREENING BILATERAL MAMMOGRAM WITH TOMO AND CAD
6 of 10 series · 6 of 30 positions shown · non-contrast
Comparison: Previous exam(s).

CLINICAL DATA: Screening.

EXAM:
DIGITAL SCREENING BILATERAL MAMMOGRAM WITH TOMO AND CAD

[L CC synth-2D]
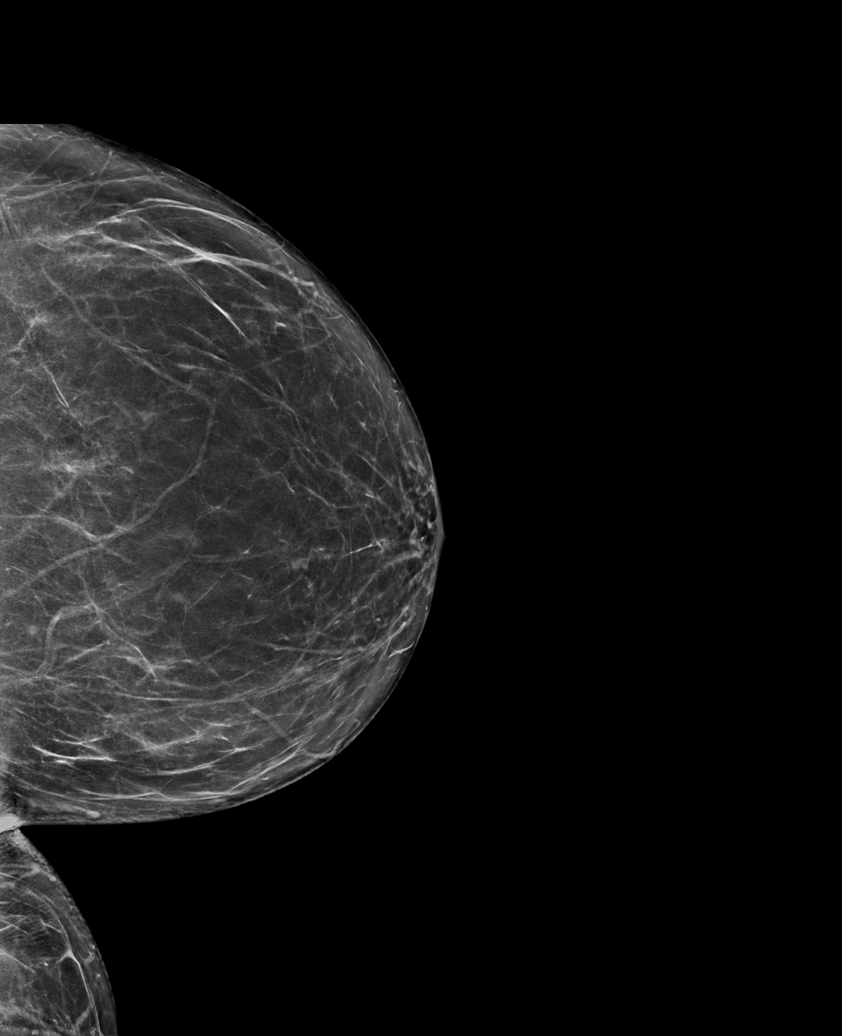

[R XCCL synth-2D]
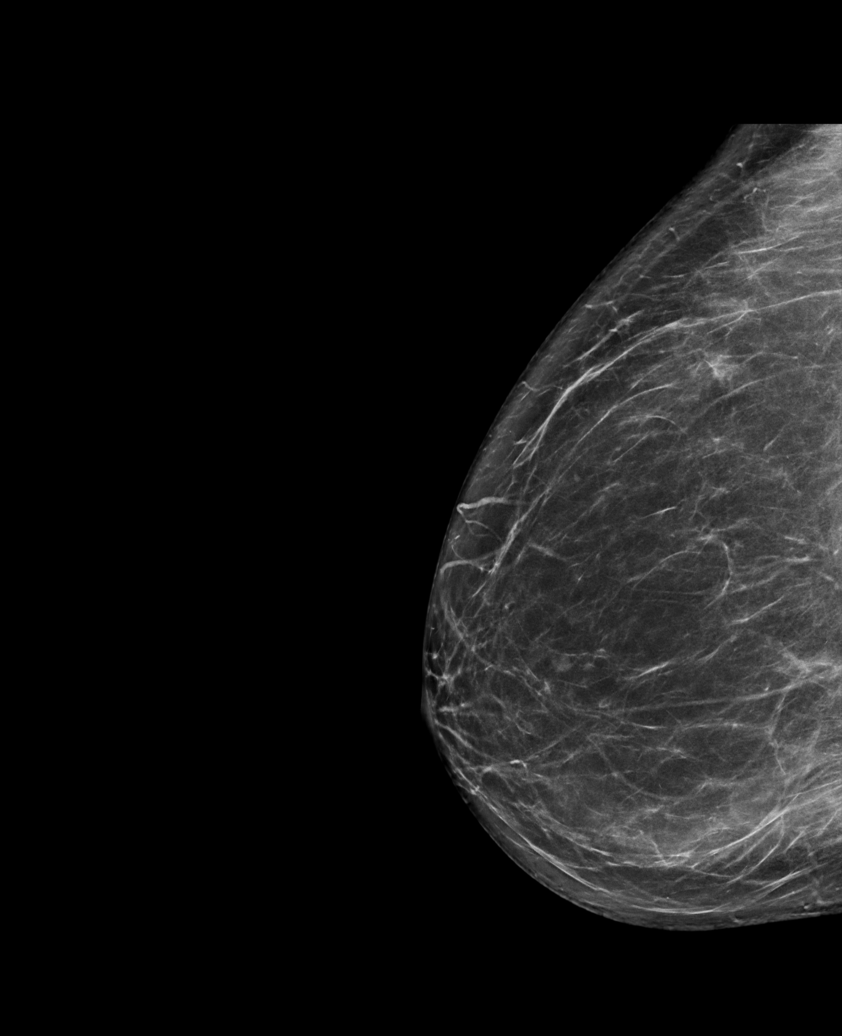

[L MLO synth-2D]
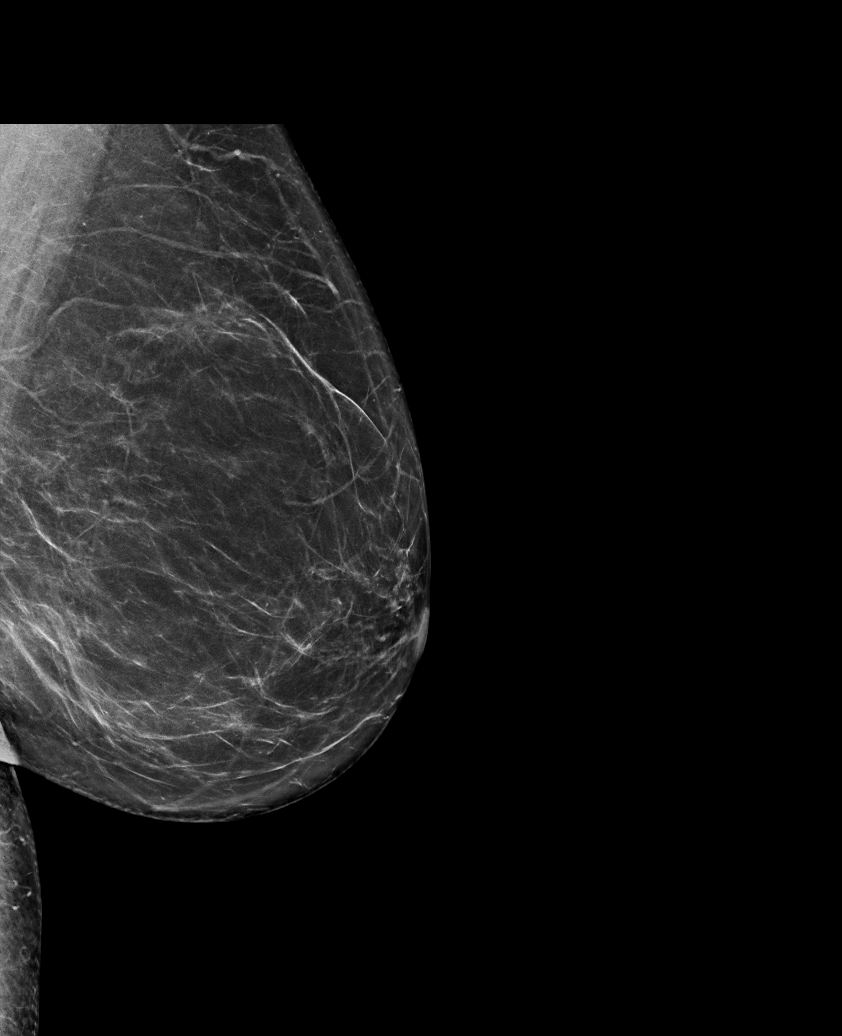

[R CC synth-2D]
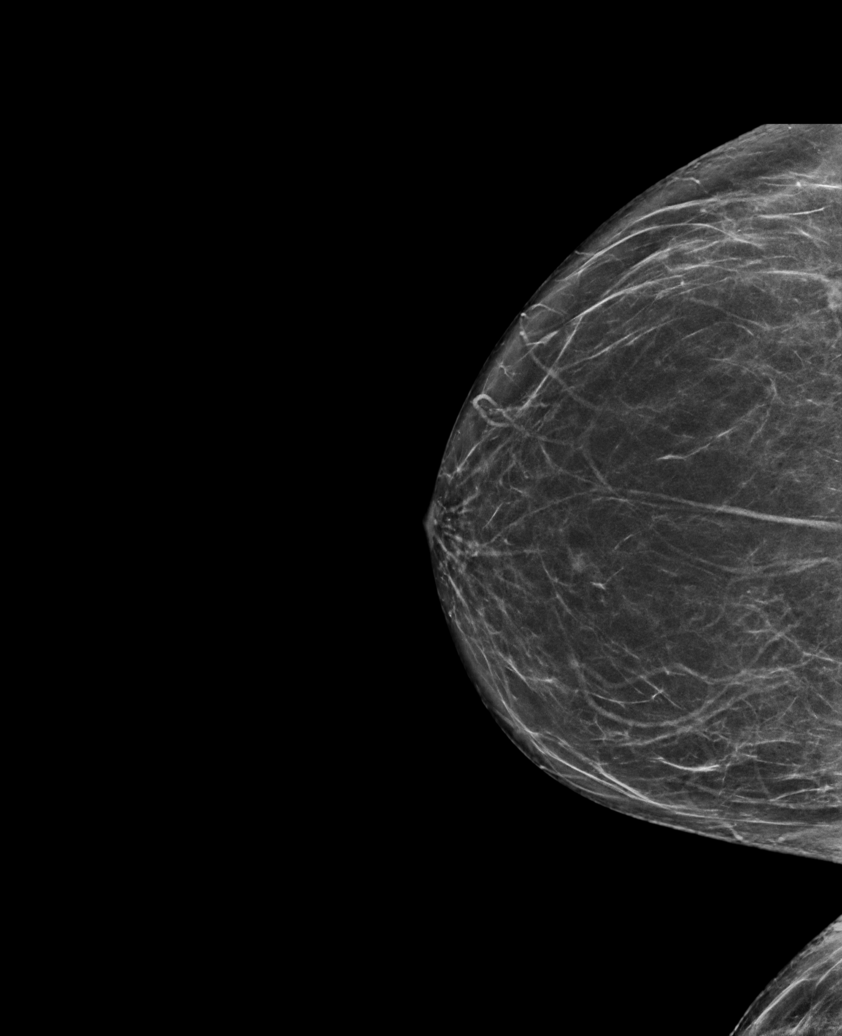

[R MLO synth-2D]
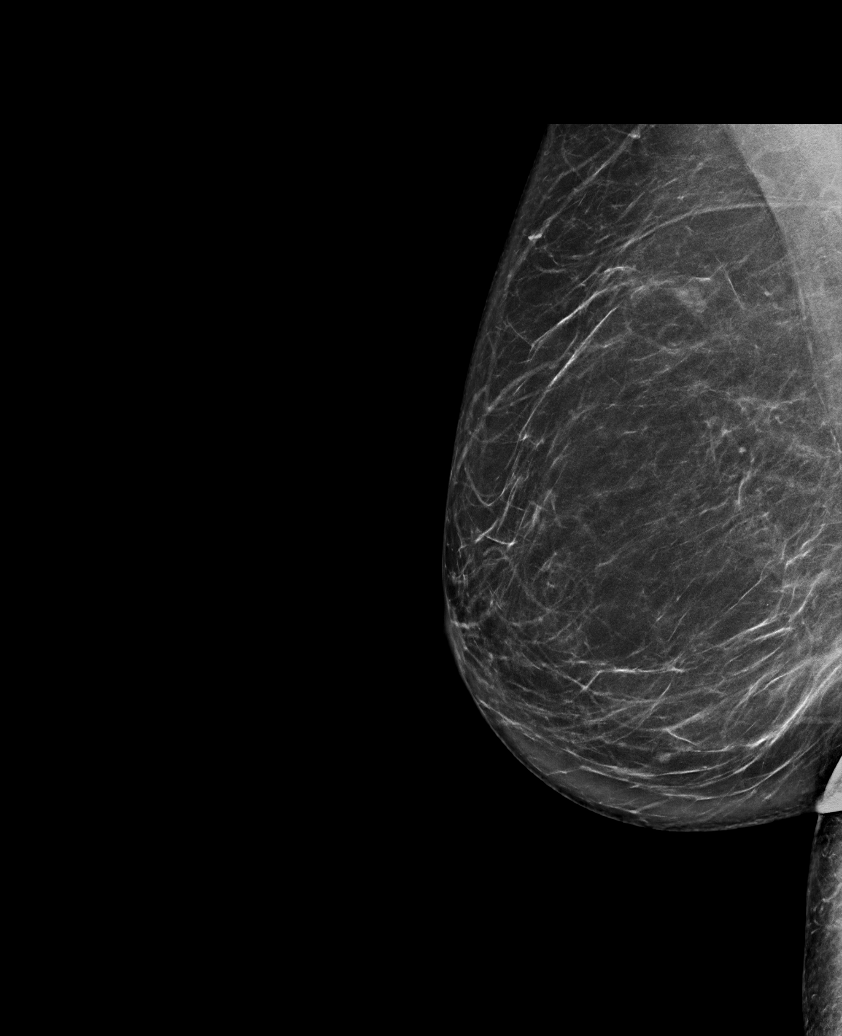

[R MLO tomo · tomo slice 39/78.0]
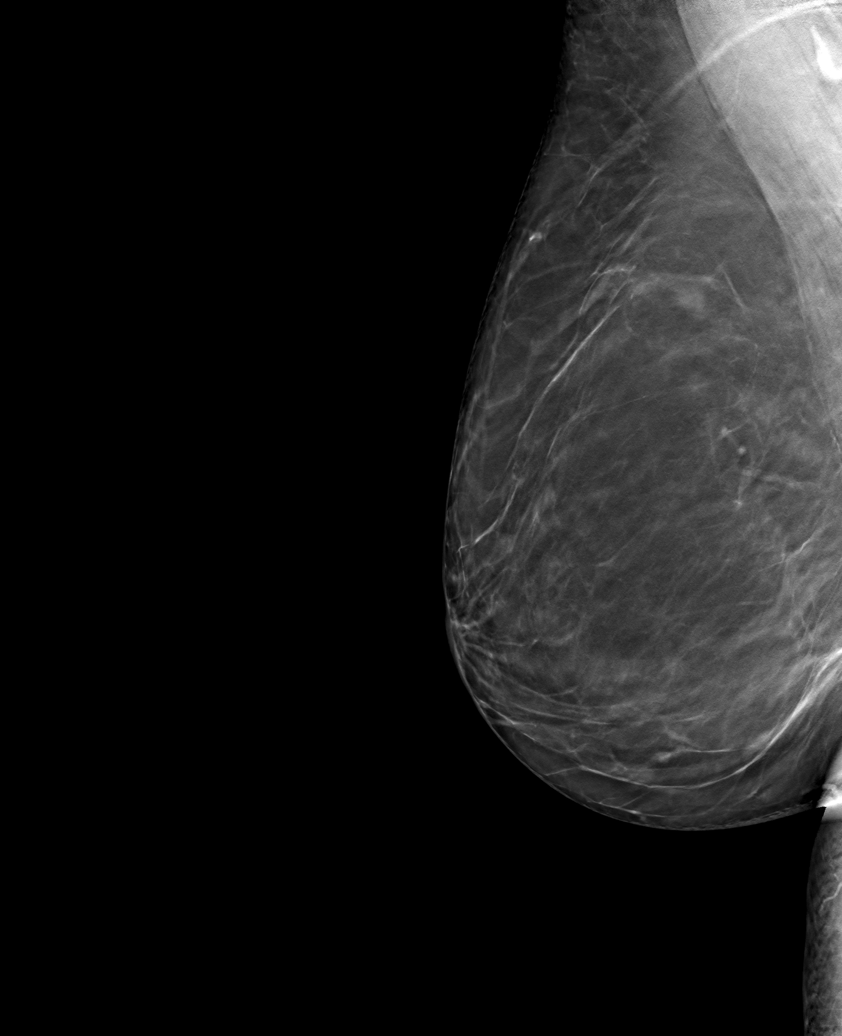

[6 of 30 positions shown; findings below may reference images not displayed]

ACR Breast Density Category b: There are scattered areas of
fibroglandular density.
FINDINGS: There are no findings suspicious for malignancy. Images were
processed with CAD.
IMPRESSION: No mammographic evidence of malignancy. A result letter of this
screening mammogram will be mailed directly to the patient.

RECOMMENDATION:
Screening mammogram in one year. (Code:CN-U-775)

BI-RADS CATEGORY  1: Negative.

## 2020-03-19 ENCOUNTER — Ambulatory Visit: Payer: Medicare Other | Admitting: Family

## 2020-03-23 ENCOUNTER — Other Ambulatory Visit: Payer: Self-pay

## 2020-03-23 ENCOUNTER — Encounter: Payer: Self-pay | Admitting: Family

## 2020-03-23 ENCOUNTER — Ambulatory Visit (INDEPENDENT_AMBULATORY_CARE_PROVIDER_SITE_OTHER): Payer: Medicare Other | Admitting: Family

## 2020-03-23 VITALS — BP 132/80 | HR 66 | Temp 98.2°F | Ht 67.0 in | Wt 187.8 lb

## 2020-03-23 DIAGNOSIS — Z13220 Encounter for screening for lipoid disorders: Secondary | ICD-10-CM | POA: Diagnosis not present

## 2020-03-23 DIAGNOSIS — J324 Chronic pansinusitis: Secondary | ICD-10-CM

## 2020-03-23 DIAGNOSIS — J40 Bronchitis, not specified as acute or chronic: Secondary | ICD-10-CM

## 2020-03-23 DIAGNOSIS — Z0000 Encounter for general adult medical examination without abnormal findings: Secondary | ICD-10-CM

## 2020-03-23 DIAGNOSIS — Z1231 Encounter for screening mammogram for malignant neoplasm of breast: Secondary | ICD-10-CM

## 2020-03-23 LAB — CBC WITH DIFFERENTIAL/PLATELET
Basophils Absolute: 0 10*3/uL (ref 0.0–0.1)
Basophils Relative: 0.4 % (ref 0.0–3.0)
Eosinophils Absolute: 0.2 10*3/uL (ref 0.0–0.7)
Eosinophils Relative: 2.6 % (ref 0.0–5.0)
HCT: 41 % (ref 36.0–46.0)
Hemoglobin: 13.7 g/dL (ref 12.0–15.0)
Lymphocytes Relative: 41.5 % (ref 12.0–46.0)
Lymphs Abs: 2.8 10*3/uL (ref 0.7–4.0)
MCHC: 33.3 g/dL (ref 30.0–36.0)
MCV: 91.4 fl (ref 78.0–100.0)
Monocytes Absolute: 0.4 10*3/uL (ref 0.1–1.0)
Monocytes Relative: 5.4 % (ref 3.0–12.0)
Neutro Abs: 3.4 10*3/uL (ref 1.4–7.7)
Neutrophils Relative %: 50.1 % (ref 43.0–77.0)
Platelets: 214 10*3/uL (ref 150.0–400.0)
RBC: 4.49 Mil/uL (ref 3.87–5.11)
RDW: 13.7 % (ref 11.5–15.5)
WBC: 6.8 10*3/uL (ref 4.0–10.5)

## 2020-03-23 LAB — LIPID PANEL
Cholesterol: 120 mg/dL (ref 0–200)
HDL: 39.6 mg/dL (ref >39.00)
LDL Cholesterol: 52 mg/dL (ref 0–99)
NonHDL: 80.47
Total CHOL/HDL Ratio: 3
Triglycerides: 140 mg/dL (ref 0.0–149.0)
VLDL: 28 mg/dL (ref 0.0–40.0)

## 2020-03-23 LAB — COMPREHENSIVE METABOLIC PANEL
ALT: 12 U/L (ref 0–35)
AST: 11 U/L (ref 0–37)
Albumin: 4.2 g/dL (ref 3.5–5.2)
Alkaline Phosphatase: 85 U/L (ref 39–117)
BUN: 14 mg/dL (ref 6–23)
CO2: 28 mEq/L (ref 19–32)
Calcium: 9.7 mg/dL (ref 8.4–10.5)
Chloride: 107 mEq/L (ref 96–112)
Creatinine, Ser: 0.73 mg/dL (ref 0.40–1.20)
GFR: 87.39 mL/min (ref >60.00)
Glucose, Bld: 88 mg/dL (ref 70–99)
Potassium: 4 mEq/L (ref 3.5–5.1)
Sodium: 142 mEq/L (ref 135–145)
Total Bilirubin: 0.4 mg/dL (ref 0.2–1.2)
Total Protein: 6.9 g/dL (ref 6.0–8.3)

## 2020-03-23 LAB — TSH: TSH: 1.42 u[IU]/mL (ref 0.35–4.50)

## 2020-03-23 LAB — VITAMIN D 25 HYDROXY (VIT D DEFICIENCY, FRACTURES): VITD: 24.31 ng/mL — ABNORMAL LOW (ref 30.00–100.00)

## 2020-03-23 MED ORDER — ALBUTEROL SULFATE HFA 108 (90 BASE) MCG/ACT IN AERS: 2.0000 | INHALATION_SPRAY | Freq: Four times a day (QID) | RESPIRATORY_TRACT | 3 refills | Status: DC | PRN

## 2020-03-23 NOTE — Progress Notes (Signed)
Anne Palmer is a 65 y.o. female with the following history as recorded in Epic[employer]:  Patient Active Problem List   Diagnosis Date Noted  . Primary osteoarthritis of both knees 09/27/2018  . 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 Hyperlipidemia 12/09/2016  . Osteopenia 12/09/2016  . Upper back pain, chronic 12/09/2016  . Chronic sinusitis 06/05/2016  . Essential hypertension 03/07/2016  . Headache around the eyes 03/07/2016  . OA (osteoarthritis) of knee 03/07/2016  . Postmenopausal state 03/07/2016  . Hyperglycemia 03/07/2016  . Tobacco use disorder 03/07/2016    Current Outpatient Medications  Medication Sig Dispense Refill  . acetaminophen (TYLENOL) 500 MG tablet Take 500 mg by mouth every 6 (six) hours as needed.    Marland Kitchen atorvastatin (LIPITOR) 40 MG tablet Take 1 tablet (40 mg total) by mouth daily at 6 PM. 90 tablet 3  . calcium carbonate (CALCIUM 600) 600 MG TABS tablet Take 1 tablet (600 mg total) by mouth 2 (two) times daily with a meal. 60 tablet   . fluticasone (FLONASE) 50 MCG/ACT nasal spray Place 2 sprays into both nostrils daily. 48 g 3  . lisinopril-hydrochlorothiazide (ZESTORETIC) 20-25 MG tablet Take 1 tablet by mouth daily. 90 tablet 3  . meloxicam (MOBIC) 15 MG tablet Take 15 mg by mouth every other day.    . albuterol (VENTOLIN HFA) 108 (90 Base) MCG/ACT inhaler Inhale 2 puffs into the lungs every 6 (six) hours as needed for wheezing or shortness of breath. 18 g 3   No current facility-administered medications for this visit.    Allergies: Sulfa antibiotics  Past Medical History:  Diagnosis Date  . Arthritis   . Diverticulitis 2015  . Hematuria   . Hypertension     Past Surgical History:  Procedure Laterality Date  . ABDOMINAL HYSTERECTOMY     secondary to endometriosis and fibroids  . COLONOSCOPY  2015   no polyps, diverticulosis  . SHOULDER SURGERY  2008   left    Family History  Problem Relation Age of Onset  . Arthritis Mother   . Stroke  Mother   . Hypertension Mother   . Diabetes Mother   . Alcohol abuse Father   . Arthritis Sister   . Hyperlipidemia Sister   . Diabetes Sister   . Lupus Sister   . Arthritis Brother   . Diabetes Brother     Social History   Tobacco Use  . Smoking status: Current Every Day Smoker    Packs/day: 0.25  . Smokeless tobacco: Never Used  Substance Use Topics  . Alcohol use: Yes    Comment: occasional    Subjective:   Presents for yearly CPE; has not been seen in our office since July 2020; it sounds like she has been seeing a different PCP in the interim-her refills are up to date on her blood pressure and cholesterol medication through July 2022; she does see her eye doctor and dentist regularly;  Not ready to quit smoking; would like to see Dr. Tamala Julian again about her chronic knee pain;   Overdue for mammogram; defers COVID vaccine;     Review of Systems  Constitutional: Negative.   HENT: Negative.   Eyes: Negative.   Respiratory: Negative.   Cardiovascular: Negative.   Gastrointestinal: Negative.   Genitourinary: Negative.   Musculoskeletal: Positive for joint pain.  Skin: Negative.   Neurological: Negative.   Endo/Heme/Allergies: Negative.      Objective:  Vitals:   03/23/20 1509  BP: 132/80  Pulse: 66  Temp: 98.2 F (36.8 C)  TempSrc: Oral  SpO2: 95%  Weight: 187 lb 12.8 oz (85.2 kg)  Height: _0  (1.702 m)    General: Well developed, well nourished, in no acute distress  Skin : Warm and dry.  Head: Normocephalic and atraumatic  Eyes: Sclera and conjunctiva clear; pupils round and reactive to light; extraocular movements intact  Ears: External normal; canals clear; tympanic membranes normal  Oropharynx: Pink, supple. No suspicious lesions  Neck: Supple without thyromegaly, adenopathy  Lungs: Respirations unlabored; clear to auscultation bilaterally without wheeze, rales, rhonchi  CVS exam: normal rate and regular rhythm.  Musculoskeletal: No  deformities; no active joint inflammation  Extremities: No edema, cyanosis, clubbing  Vessels: Symmetric bilaterally  Neurologic: Alert and oriented; speech intact; face symmetrical; moves all extremities well; CNII-XII intact without focal deficit   Assessment:  1. PE (physical exam), annual   2. Chronic pansinusitis   3. Bronchitis   4. Screening mammogram for breast cancer   5. Lipid screening     Plan:  Age appropriate preventive healthcare needs addressed; encouraged regular eye doctor and dental exams; encouraged regular exercise; will update labs and refills as needed today; follow-up to be determined;  This visit occurred during the SARS-CoV-2 public health emergency.  Safety protocols were in place, including screening questions prior to the visit, additional usage of staff PPE, and extensive cleaning of exam room while observing appropriate contact time as indicated for disinfecting solutions.     No follow-ups on file.  Orders Placed This Encounter  Procedures  . MM Digital Screening    Standing Status:   Future    Standing Expiration Date:   03/23/2021    Order Specific Question:   Reason for Exam (SYMPTOM  OR DIAGNOSIS REQUIRED)    Answer:   screening mammogram    Order Specific Question:   Preferred imaging location?    Answer:   [facility]  . CBC with Differential/Platelet    Standing Status:   Future    Number of Occurrences:   1    Standing Expiration Date:   03/23/2021  . Comp Met (CMET)    Standing Status:   Future    Number of Occurrences:   1    Standing Expiration Date:   03/23/2021  . Lipid panel    Standing Status:   Future    Number of Occurrences:   1    Standing Expiration Date:   03/23/2021  . TSH    Standing Status:   Future    Number of Occurrences:   1    Standing Expiration Date:   03/23/2021  . Vitamin D (25 hydroxy)    Standing Status:   Future    Number of Occurrences:   1    Standing Expiration Date:   03/23/2021    Requested  Prescriptions   Signed Prescriptions Disp Refills  . albuterol (VENTOLIN HFA) 108 (90 Base) MCG/ACT inhaler 18 g 3    Sig: Inhale 2 puffs into the lungs every 6 (six) hours as needed for wheezing or shortness of breath.

## 2020-03-26 ENCOUNTER — Other Ambulatory Visit: Payer: Self-pay | Admitting: Family

## 2020-03-26 MED ORDER — VITAMIN D (ERGOCALCIFEROL) 1.25 MG (50000 UNIT) PO CAPS: 50000.0000 [IU] | ORAL_CAPSULE | ORAL | 0 refills | Status: AC

## 2020-03-28 ENCOUNTER — Ambulatory Visit (INDEPENDENT_AMBULATORY_CARE_PROVIDER_SITE_OTHER)
Admission: RE | Admit: 2020-03-28 | Discharge: 2020-03-28 | Disposition: A | Discharge status: Home / Self Care | Payer: Medicare Other | Source: Ambulatory Visit | Attending: Family Medicine | Admitting: Family Medicine

## 2020-03-28 ENCOUNTER — Ambulatory Visit (INDEPENDENT_AMBULATORY_CARE_PROVIDER_SITE_OTHER): Payer: Medicare Other | Admitting: Family Medicine

## 2020-03-28 ENCOUNTER — Other Ambulatory Visit: Payer: Self-pay

## 2020-03-28 ENCOUNTER — Other Ambulatory Visit: Payer: Self-pay | Admitting: Family

## 2020-03-28 ENCOUNTER — Encounter: Payer: Self-pay | Admitting: Family Medicine

## 2020-03-28 VITALS — BP 142/82 | HR 70 | Ht 67.0 in | Wt 190.0 lb

## 2020-03-28 DIAGNOSIS — G8929 Other chronic pain: Secondary | ICD-10-CM

## 2020-03-28 DIAGNOSIS — M1712 Unilateral primary osteoarthritis, left knee: Secondary | ICD-10-CM | POA: Diagnosis not present

## 2020-03-28 DIAGNOSIS — M170 Bilateral primary osteoarthritis of knee: Secondary | ICD-10-CM

## 2020-03-28 DIAGNOSIS — M25562 Pain in left knee: Secondary | ICD-10-CM

## 2020-03-28 DIAGNOSIS — M25561 Pain in right knee: Secondary | ICD-10-CM

## 2020-03-28 DIAGNOSIS — J324 Chronic pansinusitis: Secondary | ICD-10-CM

## 2020-03-28 DIAGNOSIS — J40 Bronchitis, not specified as acute or chronic: Secondary | ICD-10-CM

## 2020-03-28 MED ORDER — ALBUTEROL SULFATE HFA 108 (90 BASE) MCG/ACT IN AERS: 2.0000 | INHALATION_SPRAY | Freq: Four times a day (QID) | RESPIRATORY_TRACT | 3 refills | Status: DC | PRN

## 2020-03-28 NOTE — Patient Instructions (Signed)
Xrays today Injected left knee today Voltaren gel  Ice 20 min 2x a day See me in 5-6 weeks for gel injection

## 2020-03-28 NOTE — Assessment & Plan Note (Signed)
Patient is only having pain on the left knee. Patient does have likely some more patellofemoral arthritis and some medial joint arthritis. We will get x-rays with it being over 2 years at this time we discussed the potential for advanced imaging if patient would consider surgical intervention but she does not think it is that bad. Patient is adamant that she felt the viscosupplementation that we did do a year and a half ago was significantly helpful. We will see if we can get approval again. Discussed icing, topical anti-inflammatories, home exercises. Follow-up again with me in 5 to 6 weeks.

## 2020-03-28 NOTE — Progress Notes (Signed)
[name] [address] [place] [address] Phone: [phone] Subjective:   [name], am serving as a scribe for Dr. Antoine Primas. This visit occurred during the SARS-CoV-2 public health emergency.  Safety protocols were in place, including screening questions prior to the visit, additional usage of staff PPE, and extensive cleaning of exam room while observing appropriate contact time as indicated for disinfecting solutions.   I'm seeing this patient by the request  of:  Olive Bass, FNP  CC: Left greater than right knee pain  [id number]  Anne Palmer is a 65 y.o. female coming in with complaint of left knee pain. Durolane given on 10/27/2018. Patient states that her pain increases with deep knee bending.  Using Meloxicam every other day as well as Tylenol. Patient states that the right knee is still doing very well. Continues to have more of the left knee pain.  Left knee xray 01/04/2018 IMPRESSION: Minimal spurring posterior patella may represent minimal patellofemoral joint degenerative changes.      Past Medical History:  Diagnosis Date  . Arthritis   . Diverticulitis 2015  . Hematuria   . Hypertension    Past Surgical History:  Procedure Laterality Date  . ABDOMINAL HYSTERECTOMY     secondary to endometriosis and fibroids  . COLONOSCOPY  2015   no polyps, diverticulosis  . SHOULDER SURGERY  2008   left   Social History   Socioeconomic History  . Marital status: Single    Spouse name: Not on file  . Number of children: Not on file  . Years of education: Not on file  . Highest education level: Not on file  Occupational History  . Not on file  Tobacco Use  . Smoking status: Current Every Day Smoker    Packs/day: 0.25  . Smokeless tobacco: Never Used  Substance and Sexual Activity  . Alcohol use: Yes    Comment: occasional  . Drug use: No  . Sexual activity: Not Currently    Birth  control/protection: None  Other Topics Concern  . Not on file  Social History Narrative  . Not on file   Social Determinants of Health   Financial Resource Strain: Not on file  Food Insecurity: Not on file  Transportation Needs: Not on file  Physical Activity: Not on file  Stress: Not on file  Social Connections: Not on file   Allergies  Allergen Reactions  . Sulfa Antibiotics Hives   Family History  Problem Relation Age of Onset  . Arthritis Mother   . Stroke Mother   . Hypertension Mother   . Diabetes Mother   . Alcohol abuse Father   . Arthritis Sister   . Hyperlipidemia Sister   . Diabetes Sister   . Lupus Sister   . Arthritis Brother   . Diabetes Brother      Current Outpatient Medications (Cardiovascular):  .  atorvastatin (LIPITOR) 40 MG tablet, Take 1 tablet (40 mg total) by mouth daily at 6 PM. .  lisinopril-hydrochlorothiazide (ZESTORETIC) 20-25 MG tablet, Take 1 tablet by mouth daily.  Current Outpatient Medications (Respiratory):  .  albuterol (VENTOLIN HFA) 108 (90 Base) MCG/ACT inhaler, Inhale 2 puffs into the lungs every 6 (six) hours as needed for wheezing or shortness of breath. .  fluticasone (FLONASE) 50 MCG/ACT nasal spray, Place 2 sprays into both nostrils daily.  Current Outpatient Medications (Analgesics):  .  acetaminophen (TYLENOL) 500 MG tablet, Take 500 mg by mouth every  6 (six) hours as needed. .  meloxicam (MOBIC) 15 MG tablet, Take 15 mg by mouth every other day.   Current Outpatient Medications (Other):  .  calcium carbonate (CALCIUM 600) 600 MG TABS tablet, Take 1 tablet (600 mg total) by mouth 2 (two) times daily with a meal. .  Vitamin D, Ergocalciferol, (DRISDOL) 1.25 MG (50000 UNIT) CAPS capsule, Take 1 capsule (50,000 Units total) by mouth every 7 (seven) days for 12 doses.   Reviewed prior external information including notes and imaging from  primary care provider As well as notes that were available from care everywhere  and other healthcare systems.  Past medical history, social, surgical and family history all reviewed in electronic medical record.  No pertanent information unless stated regarding to the chief complaint.   Review of Systems:  No headache, visual changes, nausea, vomiting, diarrhea, constipation, dizziness, abdominal pain, skin rash, fevers, chills, night sweats, weight loss, swollen lymph nodes, body aches, joint swelling, chest pain, shortness of breath, mood changes. POSITIVE muscle aches  Objective  Blood pressure (!) 142/82, pulse 70, height 5\' 7"  (1.702 m), weight 190 lb (86.2 kg), SpO2 98 %.   General: No apparent distress alert and oriented x3 mood and affect normal, dressed appropriately.  HEENT: Pupils equal, extraocular movements intact  Respiratory: Patient's speak in full sentences and does not appear short of breath  Cardiovascular: No lower extremity edema, non tender, no erythema  Gait antalgic MSK: Patient does have knee arthritic changes bilaterally. Patient does have significant lateral tracking of the patella noted. Severely tender to palpation of the medial joint line. Very mild instability with valgus and varus force. Patient lacks the last 2 degrees of extension of the knee but full flexion. Contralateral knee has arthritic changes but nontender today.  After informed written and verbal consent, patient was seated on exam table. Left knee was prepped with alcohol swab and utilizing anterolateral approach, patient's left knee space was injected with 4:1  marcaine 0.5%: Kenalog 40mg /dL. Patient tolerated the procedure well without immediate complications.    Impression and Recommendations:     The above documentation has been reviewed and is accurate and complete , DO

## 2020-04-05 ENCOUNTER — Telehealth: Payer: Self-pay

## 2020-04-05 NOTE — Telephone Encounter (Signed)
Received mail from [employer] stating drug is not on plans's formulary.  A 30 day temporary supply was filled on 03/24/20. [facility] is requesting  (1) change of prescription & try another drug that may treat condition or (2) ask [facility] to change how they cover the drug & make an exception or (3) ask [facility] to cover the drug if trhey said "no" to making an exception.  Other covered drug alternatives: Albuterol HFA (Proair), Proair HFA, or Proair Respiclick

## 2020-04-06 ENCOUNTER — Other Ambulatory Visit: Payer: Self-pay | Admitting: Family

## 2020-04-06 DIAGNOSIS — J40 Bronchitis, not specified as acute or chronic: Secondary | ICD-10-CM

## 2020-04-06 DIAGNOSIS — J324 Chronic pansinusitis: Secondary | ICD-10-CM

## 2020-04-06 MED ORDER — ALBUTEROL SULFATE HFA 108 (90 BASE) MCG/ACT IN AERS: 2.0000 | INHALATION_SPRAY | Freq: Four times a day (QID) | RESPIRATORY_TRACT | 3 refills | Status: DC | PRN

## 2020-04-06 NOTE — Telephone Encounter (Signed)
Will send in ProAir as alternative to her Ventolin;

## 2020-05-08 ENCOUNTER — Encounter: Payer: Self-pay | Admitting: Family Medicine

## 2020-05-08 ENCOUNTER — Other Ambulatory Visit: Payer: Self-pay

## 2020-05-08 ENCOUNTER — Ambulatory Visit (INDEPENDENT_AMBULATORY_CARE_PROVIDER_SITE_OTHER): Payer: Medicare Other | Admitting: Family Medicine

## 2020-05-08 DIAGNOSIS — M170 Bilateral primary osteoarthritis of knee: Secondary | ICD-10-CM | POA: Diagnosis not present

## 2020-05-08 NOTE — Patient Instructions (Signed)
Arnica lotion See me again in 6 weeks if you need [place] Call if redness or swelling

## 2020-05-08 NOTE — Assessment & Plan Note (Signed)
Viscosupplementation given today.  Tolerated the procedure well.  Patient did have significant improvement previously and hopefully will have it again.  Discussed with patient about icing regimen and home exercises.  Patient does take Tylenol but has discontinued the meloxicam.  Follow-up with me again 2 to 3 months

## 2020-05-08 NOTE — Progress Notes (Signed)
[name] [address] [place] [address] Phone: [phone] Subjective:   [name], am serving as a scribe for Dr. Antoine Primas. This visit occurred during the SARS-CoV-2 public health emergency.  Safety protocols were in place, including screening questions prior to the visit, additional usage of staff PPE, and extensive cleaning of exam room while observing appropriate contact time as indicated for disinfecting solutions.   I'm seeing this patient by the request  of:  Olive Bass, FNP  CC: Left knee pain follow-up  [id number]   03/28/2020 Patient is only having pain on the left knee. Patient does have likely some more patellofemoral arthritis and some medial joint arthritis. We will get x-rays with it being over 2 years at this time we discussed the potential for advanced imaging if patient would consider surgical intervention but she does not think it is that bad. Patient is adamant that she felt the viscosupplementation that we did do a year and a half ago was significantly helpful. We will see if we can get approval again. Discussed icing, topical anti-inflammatories, home exercises. Follow-up again with me in 5 to 6 weeks.  Update 05/08/2020 Anne Palmer is a 65 y.o. female coming in with complaint of bilateral knee pain, L>R. Noticed the Voltaren gel was causing skin irritation  knee. Was able to go for a walk the other day.  Patient will has had the viscosupplementation previously and would like to consider going through this again.    Patient's x-rays show mild to moderate arthritic changes of the knees lateral compartment worse.  Past Medical History:  Diagnosis Date  . Arthritis   . Diverticulitis 2015  . Hematuria   . Hypertension    Past Surgical History:  Procedure Laterality Date  . ABDOMINAL HYSTERECTOMY     secondary to endometriosis and fibroids  . COLONOSCOPY  2015   no polyps, diverticulosis  .  SHOULDER SURGERY  2008   left   Social History   Socioeconomic History  . Marital status: Single    Spouse name: Not on file  . Number of children: Not on file  . Years of education: Not on file  . Highest education level: Not on file  Occupational History  . Not on file  Tobacco Use  . Smoking status: Current Every Day Smoker    Packs/day: 0.25  . Smokeless tobacco: Never Used  Substance and Sexual Activity  . Alcohol use: Yes    Comment: occasional  . Drug use: No  . Sexual activity: Not Currently    Birth control/protection: None  Other Topics Concern  . Not on file  Social History Narrative  . Not on file   Social Determinants of Health   Financial Resource Strain: Not on file  Food Insecurity: Not on file  Transportation Needs: Not on file  Physical Activity: Not on file  Stress: Not on file  Social Connections: Not on file   Allergies  Allergen Reactions  . Sulfa Antibiotics Hives   Family History  Problem Relation Age of Onset  . Arthritis Mother   . Stroke Mother   . Hypertension Mother   . Diabetes Mother   . Alcohol abuse Father   . Arthritis Sister   . Hyperlipidemia Sister   . Diabetes Sister   . Lupus Sister   . Arthritis Brother   . Diabetes Brother      Current Outpatient Medications (Cardiovascular):  .  atorvastatin (LIPITOR) 40  MG tablet, Take 1 tablet (40 mg total) by mouth daily at 6 PM. .  lisinopril-hydrochlorothiazide (ZESTORETIC) 20-25 MG tablet, Take 1 tablet by mouth daily.  Current Outpatient Medications (Respiratory):  .  albuterol (VENTOLIN HFA) 108 (90 Base) MCG/ACT inhaler, Inhale 2 puffs into the lungs every 6 (six) hours as needed for wheezing or shortness of breath. .  fluticasone (FLONASE) 50 MCG/ACT nasal spray, Place 2 sprays into both nostrils daily.  Current Outpatient Medications (Analgesics):  .  acetaminophen (TYLENOL) 500 MG tablet, Take 500 mg by mouth every 6 (six) hours as needed. .  meloxicam (MOBIC) 15  MG tablet, Take 15 mg by mouth every other day.   Current Outpatient Medications (Other):  .  calcium carbonate (CALCIUM 600) 600 MG TABS tablet, Take 1 tablet (600 mg total) by mouth 2 (two) times daily with a meal. .  Vitamin D, Ergocalciferol, (DRISDOL) 1.25 MG (50000 UNIT) CAPS capsule, Take 1 capsule (50,000 Units total) by mouth every 7 (seven) days for 12 doses.   Reviewed prior external information including notes and imaging from  primary care provider As well as notes that were available from care everywhere and other healthcare systems.  Past medical history, social, surgical and family history all reviewed in electronic medical record.  No pertanent information unless stated regarding to the chief complaint.   Review of Systems:  No headache, visual changes, nausea, vomiting, diarrhea, constipation, dizziness, abdominal pain, skin rash, fevers, chills, night sweats, weight loss, swollen lymph nodes, body aches, joint swelling, chest pain, shortness of breath, mood changes.   Objective  Blood pressure 116/64, pulse 70, height 5\' 7"  (1.702 m), weight 190 lb (86.2 kg), SpO2 99 %.   General: No apparent distress alert and oriented x3 mood and affect normal, dressed appropriately.  HEENT: Pupils equal, extraocular movements intact  Respiratory: Patient's speak in full sentences and does not appear short of breath  Cardiovascular: No lower extremity edema, non tender, no erythema  Gait normal with good balance and coordination.  MSK: Left knee exam does have some arthritic changes noted.  Patient is nontender in the calf area.  Tenderness to palpation on the lateral joint line as well as the patellofemoral joint.  Mild crepitus noted.  Contralateral knee does have some arthritic changes but nontender.  After informed written and verbal consent, patient was seated on exam table. Left knee was prepped with alcohol swab and utilizing anterolateral approach, patient's left knee space was  injected with 60 mg per 3 mL of Durolane (sodium hyaluronate) in a prefilled syringe was injected easily into the knee through a 22-gauge needle..Patient tolerated the procedure well without immediate complications.    Impression and Recommendations:     The above documentation has been reviewed and is accurate and complete , DO

## 2020-06-19 ENCOUNTER — Ambulatory Visit: Payer: Medicare Other | Admitting: Family Medicine

## 2020-08-07 DIAGNOSIS — H2513 Age-related nuclear cataract, bilateral: Secondary | ICD-10-CM | POA: Diagnosis not present

## 2020-08-07 DIAGNOSIS — H524 Presbyopia: Secondary | ICD-10-CM | POA: Diagnosis not present

## 2020-08-15 ENCOUNTER — Encounter: Payer: Self-pay | Admitting: Family

## 2020-08-15 MED ORDER — LISINOPRIL-HYDROCHLOROTHIAZIDE 20-25 MG PO TABS: 1.0000 | ORAL_TABLET | Freq: Every day | ORAL | 1 refills | Status: DC

## 2020-08-23 ENCOUNTER — Other Ambulatory Visit: Payer: Self-pay

## 2020-08-23 ENCOUNTER — Ambulatory Visit
Admission: RE | Admit: 2020-08-23 | Discharge: 2020-08-23 | Disposition: A | Discharge status: Home / Self Care | Payer: Medicare Other | Source: Ambulatory Visit | Attending: Family | Admitting: Family

## 2020-08-23 ENCOUNTER — Other Ambulatory Visit: Payer: Self-pay | Admitting: Family

## 2020-08-23 DIAGNOSIS — Z1231 Encounter for screening mammogram for malignant neoplasm of breast: Secondary | ICD-10-CM | POA: Diagnosis not present

## 2021-01-02 ENCOUNTER — Other Ambulatory Visit: Payer: Self-pay | Admitting: Family

## 2021-06-18 ENCOUNTER — Other Ambulatory Visit: Payer: Self-pay | Admitting: Family

## 2021-06-18 DIAGNOSIS — F17210 Nicotine dependence, cigarettes, uncomplicated: Secondary | ICD-10-CM | POA: Diagnosis not present

## 2021-06-18 DIAGNOSIS — K648 Other hemorrhoids: Secondary | ICD-10-CM | POA: Diagnosis not present

## 2021-06-18 DIAGNOSIS — Z79899 Other long term (current) drug therapy: Secondary | ICD-10-CM | POA: Diagnosis not present

## 2021-06-18 DIAGNOSIS — K5790 Diverticulosis of intestine, part unspecified, without perforation or abscess without bleeding: Secondary | ICD-10-CM | POA: Diagnosis not present

## 2021-06-18 DIAGNOSIS — Z0001 Encounter for general adult medical examination with abnormal findings: Secondary | ICD-10-CM | POA: Diagnosis not present

## 2021-06-18 DIAGNOSIS — I10 Essential (primary) hypertension: Secondary | ICD-10-CM | POA: Diagnosis not present

## 2021-06-18 DIAGNOSIS — J449 Chronic obstructive pulmonary disease, unspecified: Secondary | ICD-10-CM | POA: Diagnosis not present

## 2021-06-18 DIAGNOSIS — E559 Vitamin D deficiency, unspecified: Secondary | ICD-10-CM | POA: Diagnosis not present

## 2021-06-18 DIAGNOSIS — R7303 Prediabetes: Secondary | ICD-10-CM | POA: Diagnosis not present

## 2021-06-18 DIAGNOSIS — E785 Hyperlipidemia, unspecified: Secondary | ICD-10-CM | POA: Diagnosis not present

## 2021-06-20 ENCOUNTER — Other Ambulatory Visit: Payer: Self-pay | Admitting: Family

## 2021-06-20 DIAGNOSIS — Z780 Asymptomatic menopausal state: Secondary | ICD-10-CM

## 2021-07-16 ENCOUNTER — Ambulatory Visit
Admission: RE | Admit: 2021-07-16 | Discharge: 2021-07-16 | Disposition: A | Discharge status: Home / Self Care | Payer: Medicare Other | Source: Ambulatory Visit | Attending: Family | Admitting: Family

## 2021-07-16 ENCOUNTER — Other Ambulatory Visit: Payer: Self-pay | Admitting: Family

## 2021-07-16 DIAGNOSIS — M25552 Pain in left hip: Secondary | ICD-10-CM

## 2021-07-25 ENCOUNTER — Other Ambulatory Visit: Payer: Self-pay | Admitting: Family

## 2021-07-25 DIAGNOSIS — Z1231 Encounter for screening mammogram for malignant neoplasm of breast: Secondary | ICD-10-CM

## 2021-08-08 ENCOUNTER — Other Ambulatory Visit: Payer: Self-pay | Admitting: Family

## 2021-08-11 ENCOUNTER — Other Ambulatory Visit: Payer: Self-pay | Admitting: Family

## 2021-08-27 ENCOUNTER — Ambulatory Visit
Admission: RE | Admit: 2021-08-27 | Discharge: 2021-08-27 | Disposition: A | Discharge status: Home / Self Care | Payer: Medicare Other | Source: Ambulatory Visit | Attending: Family | Admitting: Family

## 2021-08-27 DIAGNOSIS — Z1231 Encounter for screening mammogram for malignant neoplasm of breast: Secondary | ICD-10-CM

## 2021-10-30 ENCOUNTER — Other Ambulatory Visit: Payer: Self-pay | Admitting: Family

## 2021-10-30 ENCOUNTER — Ambulatory Visit
Admission: RE | Admit: 2021-10-30 | Discharge: 2021-10-30 | Disposition: A | Discharge status: Home / Self Care | Payer: Medicare Other | Source: Ambulatory Visit | Attending: Family | Admitting: Family

## 2021-10-30 DIAGNOSIS — M159 Polyosteoarthritis, unspecified: Secondary | ICD-10-CM

## 2021-12-12 ENCOUNTER — Ambulatory Visit
Admission: RE | Admit: 2021-12-12 | Discharge: 2021-12-12 | Disposition: A | Discharge status: Home / Self Care | Payer: Medicare Other | Source: Ambulatory Visit | Attending: Family | Admitting: Family

## 2021-12-12 DIAGNOSIS — Z780 Asymptomatic menopausal state: Secondary | ICD-10-CM

## 2022-02-11 ENCOUNTER — Encounter: Payer: Self-pay | Admitting: Family

## 2022-02-12 ENCOUNTER — Other Ambulatory Visit: Payer: Self-pay | Admitting: Family

## 2022-02-12 ENCOUNTER — Ambulatory Visit
Admission: RE | Admit: 2022-02-12 | Discharge: 2022-02-12 | Disposition: A | Discharge status: Home / Self Care | Payer: Medicare Other | Source: Ambulatory Visit | Attending: Family | Admitting: Family

## 2022-02-12 ENCOUNTER — Encounter: Payer: Self-pay | Admitting: Family

## 2022-02-12 DIAGNOSIS — M25551 Pain in right hip: Secondary | ICD-10-CM

## 2022-05-12 ENCOUNTER — Ambulatory Visit (INDEPENDENT_AMBULATORY_CARE_PROVIDER_SITE_OTHER): Payer: 59 | Admitting: Podiatry

## 2022-05-12 DIAGNOSIS — L600 Ingrowing nail: Secondary | ICD-10-CM | POA: Diagnosis not present

## 2022-05-12 NOTE — Patient Instructions (Signed)

## 2022-05-12 NOTE — Progress Notes (Signed)
   Chief Complaint  Patient presents with   Ingrown Toenail    Patient came in today for bilateral medial borders ingrown toenails, some redness, no drainage,     Subjective: Patient presents today for evaluation of pain to the medial border of the bilateral great toes. Patient is concerned for possible ingrown nail.  It is very sensitive to touch.  Patient states that she has had sensitivity to this area off and on now for several years.  Patient presents today for further treatment and evaluation.  Past Medical History:  Diagnosis Date   Arthritis    Diverticulitis 2015   Hematuria    Hypertension    Past Surgical History:  Procedure Laterality Date   ABDOMINAL HYSTERECTOMY     secondary to endometriosis and fibroids   COLONOSCOPY  2015   no polyps, diverticulosis   SHOULDER SURGERY  2008   left   Allergies  Allergen Reactions   Sulfa Antibiotics Hives    Objective:  General: Well developed, nourished, in no acute distress, alert and oriented x3   Dermatology: Skin is warm, dry and supple bilateral.  Medial border bilateral great toes is tender with evidence of an ingrowing nail. Pain on palpation noted to the border of the nail fold. The remaining nails appear unremarkable at this time. There are no open sores, lesions.  Vascular: DP and PT pulses palpable.  No clinical evidence of vascular compromise  Neruologic: Grossly intact via light touch bilateral.  Musculoskeletal: No pedal deformity noted  Assesement: #1 Paronychia with ingrowing nail medial border bilateral great toes  Plan of Care:  1. Patient evaluated.  2. Discussed treatment alternatives and plan of care. Explained nail avulsion procedure and post procedure course to patient. 3. Patient opted for permanent partial nail avulsion of the ingrown portion of the nail.  4. Prior to procedure, local anesthesia infiltration utilized using 3 ml of a 50:50 mixture of 2% plain lidocaine and 0.5% plain marcaine  in a normal hallux block fashion and a betadine prep performed.  5. Partial permanent nail avulsion with chemical matrixectomy performed using [id number] applications of phenol followed by alcohol flush.  6. Light dressing applied.  Post care instructions provided 7.  Return to clinic 3 weeks  Edrick Kins, DPM [facility]  Dr. Edrick Kins, DPM    [address], [place] [address]                Office [phone]  Fax [phone]

## 2022-05-13 ENCOUNTER — Encounter: Payer: Self-pay | Admitting: Podiatry

## 2022-06-02 ENCOUNTER — Ambulatory Visit: Payer: 59 | Admitting: Podiatry

## 2022-06-04 ENCOUNTER — Ambulatory Visit (INDEPENDENT_AMBULATORY_CARE_PROVIDER_SITE_OTHER): Payer: 59 | Admitting: Podiatry

## 2022-06-04 DIAGNOSIS — L600 Ingrowing nail: Secondary | ICD-10-CM | POA: Diagnosis not present

## 2022-06-04 NOTE — Progress Notes (Signed)
   Chief Complaint  Patient presents with   Ingrown Toenail    Patient came in today for bilateral ingrown nail check, patient states that the nails are still sore     Subjective: 67 y.o. female presents today status post permanent nail avulsion procedure of the medial border of the bilateral great toes that was performed on 05/12/2022.  Patient doing well.  She has been soaking her foot and applying the antibiotic ointment as instructed.  She continues to have some slight tenderness but overall doing well.   Past Medical History:  Diagnosis Date   Arthritis    Diverticulitis 2015   Hematuria    Hypertension     Objective: Neurovascular status intact.  Skin is warm, dry and supple. Nail and respective nail fold appears to be healing appropriately.   Assessment: #1 s/p partial permanent nail matrixectomy bilateral great toes   Plan of care: #1 patient was evaluated  #2 light debridement of the periungual debris was performed to the border of the respective toe and nail plate using a tissue nipper. #3 patient is to return to clinic on a PRN basis.   Edrick Kins, DPM [facility]  Dr. Edrick Kins, DPM    [address], [place] [address]                Office [phone]  Fax [phone]

## 2022-06-25 DIAGNOSIS — I110 Hypertensive heart disease with heart failure: Secondary | ICD-10-CM | POA: Diagnosis not present

## 2022-06-25 DIAGNOSIS — B029 Zoster without complications: Secondary | ICD-10-CM | POA: Diagnosis not present

## 2022-07-10 ENCOUNTER — Encounter: Payer: Self-pay | Admitting: Family

## 2022-07-10 ENCOUNTER — Ambulatory Visit (INDEPENDENT_AMBULATORY_CARE_PROVIDER_SITE_OTHER): Payer: 59 | Admitting: Family

## 2022-07-10 ENCOUNTER — Telehealth: Payer: Self-pay | Admitting: *Deleted

## 2022-07-10 VITALS — BP 126/74 | HR 65 | Temp 97.9°F | Resp 17 | Ht 67.0 in | Wt 171.2 lb

## 2022-07-10 DIAGNOSIS — E782 Mixed hyperlipidemia: Secondary | ICD-10-CM | POA: Diagnosis not present

## 2022-07-10 DIAGNOSIS — E559 Vitamin D deficiency, unspecified: Secondary | ICD-10-CM | POA: Diagnosis not present

## 2022-07-10 DIAGNOSIS — Z113 Encounter for screening for infections with a predominantly sexual mode of transmission: Secondary | ICD-10-CM

## 2022-07-10 DIAGNOSIS — M170 Bilateral primary osteoarthritis of knee: Secondary | ICD-10-CM | POA: Diagnosis not present

## 2022-07-10 DIAGNOSIS — R519 Headache, unspecified: Secondary | ICD-10-CM | POA: Diagnosis not present

## 2022-07-10 DIAGNOSIS — I10 Essential (primary) hypertension: Secondary | ICD-10-CM

## 2022-07-10 DIAGNOSIS — N3281 Overactive bladder: Secondary | ICD-10-CM | POA: Diagnosis not present

## 2022-07-10 DIAGNOSIS — Z1159 Encounter for screening for other viral diseases: Secondary | ICD-10-CM

## 2022-07-10 DIAGNOSIS — R7303 Prediabetes: Secondary | ICD-10-CM

## 2022-07-10 DIAGNOSIS — M810 Age-related osteoporosis without current pathological fracture: Secondary | ICD-10-CM

## 2022-07-10 NOTE — Telephone Encounter (Signed)
Per Carilyn Goodpasture (sent Chat Message)  Please schedule my new admit Anne Palmer for prolia injection in June,2024

## 2022-07-10 NOTE — Progress Notes (Signed)
Provider: Richarda Blade FNP-C   Vinson Tietze, [name], NP  Patient Care Team: Telina Kleckley, [name], NP as PCP - General (Family Medicine)  Extended Emergency Contact Information Primary Emergency Contact: Degeorge,Chandris Address: [address]          [place] [address] [place] Home Phone: [phone] Mobile Phone: [phone] Relation: Daughter Secondary Emergency Contact: Browder,Stacy Home Phone: [phone] Mobile Phone: [phone] Relation: Brother Preferred language: English Interpreter needed? No  Code Status:  Full Code  Goals of care: Advanced Directive information    07/10/2022   12:56 PM  Advanced Directives  Does Patient Have a Medical Advance Directive? Yes  Does patient want to make changes to medical advance directive? No - Patient declined     Chief Complaint  Patient presents with   New Patient (Initial Visit)    Patient states she is here to establish care   Immunizations    Discussed the need for covid vaccine, tdap ,shingles,    Health Maintenance    Discussed the need for hiv screening and AWV    HPI:  Pt is a 67 y.o. female seen today establish care here at [facility] for medical management of chronic diseases.Has a medical history of essential hypertension, hyperlipidemia, COPD, prediabetes, cataracts of left eye, thoracic aortic atherosclerosis, overactive bladder, osteoarthritis of both knee, osteoporosis on Prolia due to June, 2024, chronic headaches around the eyes, diverticulosis, alcohol use, cigarette smoking among others.  State follows up with ophthalmologist Dr. Yetta Barre for left eye cataracts.  Exercises by walking about 4 blocks every other day.  Stated limited due to bilateral knee pain secondary to arthritis.  Smokes 6 cigarettes/day.Has tried quitting and continues to attempt to quit.  Drinks alcohol 1 shot every 6 to 7 months.  Stated not a heavy drinker can easily quit.  States due for  Prolia injection due June, 11 th 2024. Previous bone density showed right femur osteoporosis with a T-score of -2.6. On vitamin D supplements.  Recently had a second episode of shingles on the buttock. Had an episode in 2018. Has no neuralgia.     Past Medical History:  Diagnosis Date   Arthritis    Diverticulitis 2015   Hematuria    Hypertension    Past Surgical History:  Procedure Laterality Date   ABDOMINAL HYSTERECTOMY     secondary to endometriosis and fibroids   COLONOSCOPY  2015   no polyps, diverticulosis   SHOULDER SURGERY  2008   left    Allergies  Allergen Reactions   Sulfa Antibiotics Hives    Allergies as of 07/10/2022       Reactions   Sulfa Antibiotics Hives        Medication List        Accurate as of Jul 10, 2022  1:36 PM. If you have any questions, ask your nurse or doctor.          acetaminophen 500 MG tablet Commonly known as: TYLENOL Take 500 mg by mouth every 6 (six) hours as needed.   albuterol 108 (90 Base) MCG/ACT inhaler Commonly known as: VENTOLIN HFA USE 2 INHALATIONS BY MOUTH  EVERY 6 HOURS AS NEEDED FOR WHEEZING OR SHORTNESS OF  BREATH   atorvastatin 20 MG tablet Commonly known as: LIPITOR Take 20 mg by mouth daily.   calcium carbonate 600 MG Tabs tablet Commonly known as: Calcium 600 Take 1 tablet (600 mg total) by mouth 2 (two) times daily with  a meal.   denosumab 60 MG/ML Sosy injection Commonly known as: PROLIA Inject 60 mg into the skin every 6 (six) months.   fluticasone 50 MCG/ACT nasal spray Commonly known as: FLONASE Place 2 sprays into both nostrils daily.   lisinopril-hydrochlorothiazide 20-25 MG tablet Commonly known as: ZESTORETIC TAKE 1 TABLET BY MOUTH DAILY   meloxicam 15 MG tablet Commonly known as: MOBIC Take 15 mg by mouth every other day.   Myrbetriq 50 MG Tb24 tablet Generic drug: mirabegron ER Take 50 mg by mouth daily.        Review of Systems  Constitutional:  Negative for appetite  change, chills, fatigue, fever and unexpected weight change.  HENT:  Positive for dental problem. Negative for congestion, ear discharge, ear pain, facial swelling, hearing loss, nosebleeds, postnasal drip, rhinorrhea, sinus pressure, sinus pain, sneezing, sore throat, tinnitus and trouble swallowing.        Dentures   Eyes:  Positive for visual disturbance. Negative for pain, discharge, redness and itching.       Cataract left eye follows up with Dr.Jones   Respiratory:  Positive for wheezing. Negative for cough, chest tightness and shortness of breath.        COPD albuterol effective   Cardiovascular:  Negative for chest pain, palpitations and leg swelling.  Gastrointestinal:  Negative for abdominal distention, abdominal pain, blood in stool, constipation, diarrhea, nausea and vomiting.  Endocrine: Negative for cold intolerance, heat intolerance, polydipsia, polyphagia and polyuria.  Genitourinary:  Negative for difficulty urinating, dysuria, flank pain, frequency and urgency.  Musculoskeletal:  Positive for arthralgias. Negative for back pain, gait problem, joint swelling, myalgias, neck pain and neck stiffness.       Right knee pain   Skin:  Negative for color change, pallor, rash and wound.  Neurological:  Positive for numbness and headaches. Negative for dizziness, syncope, speech difficulty, weakness and light-headedness.       Headache around the eyes  Chronic Numbness and tingling of left ring and pinky fingers    Hematological:  Does not bruise/bleed easily.  Psychiatric/Behavioral:  Negative for agitation, behavioral problems, confusion, hallucinations, self-injury, sleep disturbance and suicidal ideas. The patient is not nervous/anxious.        Sleeps at least 8 hrs      There is no immunization history on file for this patient. Pertinent  Health Maintenance Due  Topic Date Due   INFLUENZA VACCINE  10/02/2022   COLONOSCOPY (Pts 45-76yrs Insurance coverage will need to be  confirmed)  04/23/2023   MAMMOGRAM  08/28/2023   DEXA SCAN  Completed   PAP SMEAR-Modifier  Discontinued      03/07/2016    9:11 AM 12/09/2016   10:19 AM 07/10/2022   12:56 PM  Fall Risk  Falls in the past year? No No 0  Was there an injury with Fall?   0  Fall Risk Category Calculator   0  Patient at Risk for Falls Due to   No Fall Risks  Fall risk Follow up   Falls evaluation completed   Functional Status Survey:    Vitals:   07/10/22 1252  BP: 126/74  Pulse: 65  Resp: 17  Temp: 97.9 F (36.6 C)  TempSrc: Temporal  SpO2: 98%  Weight: 171 lb 3.2 oz (77.7 kg)  Height: 5\' 7"  (1.702 m)   Body mass index is 26.81 kg/m. Physical Exam Vitals reviewed.  Constitutional:      General: She is not in acute distress.  Appearance: Normal appearance. She is overweight. She is not ill-appearing or diaphoretic.  HENT:     Head: Normocephalic.     Right Ear: Tympanic membrane, ear canal and external ear normal. There is no impacted cerumen.     Left Ear: Tympanic membrane, ear canal and external ear normal. There is no impacted cerumen.     Nose: Nose normal. No congestion or rhinorrhea.     Mouth/Throat:     Mouth: Mucous membranes are moist.     Pharynx: Oropharynx is clear. No oropharyngeal exudate or posterior oropharyngeal erythema.  Eyes:     General: No scleral icterus.       Right eye: No discharge.        Left eye: No discharge.     Extraocular Movements: Extraocular movements intact.     Conjunctiva/sclera: Conjunctivae normal.     Pupils: Pupils are equal, round, and reactive to light.  Neck:     Vascular: No carotid bruit.  Cardiovascular:     Rate and Rhythm: Normal rate and regular rhythm.     Pulses: Normal pulses.     Heart sounds: Normal heart sounds. No murmur heard.    No friction rub. No gallop.  Pulmonary:     Effort: Pulmonary effort is normal. No respiratory distress.     Breath sounds: Normal breath sounds. No wheezing, rhonchi or rales.  Chest:      Chest wall: No tenderness.  Abdominal:     General: Bowel sounds are normal. There is no distension.     Palpations: Abdomen is soft. There is no mass.     Tenderness: There is no abdominal tenderness. There is no right CVA tenderness, left CVA tenderness, guarding or rebound.  Musculoskeletal:        General: No swelling or tenderness. Normal range of motion.     Cervical back: Normal range of motion. No rigidity or tenderness.     Right lower leg: No edema.     Left lower leg: No edema.  Lymphadenopathy:     Cervical: No cervical adenopathy.  Skin:    General: Skin is warm and dry.     Coloration: Skin is not pale.     Findings: No bruising, erythema, lesion or rash.  Neurological:     Mental Status: She is alert and oriented to person, place, and time.     Cranial Nerves: No cranial nerve deficit.     Sensory: No sensory deficit.     Motor: No weakness.     Coordination: Coordination normal.     Gait: Gait normal.  Psychiatric:        Mood and Affect: Mood normal.        Speech: Speech normal.        Behavior: Behavior normal.        Thought Content: Thought content normal.        Judgment: Judgment normal.    Labs reviewed: No results for input(s): "NA", "K", "CL", "CO2", "GLUCOSE", "BUN", "CREATININE", "CALCIUM", "MG", "PHOS" in the last 8760 hours. No results for input(s): "AST", "ALT", "ALKPHOS", "BILITOT", "PROT", "ALBUMIN" in the last 8760 hours. No results for input(s): "WBC", "NEUTROABS", "HGB", "HCT", "MCV", "PLT" in the last 8760 hours. Lab Results  Component Value Date   TSH 1.42 03/23/2020   Lab Results  Component Value Date   HGBA1C 5.8 03/29/2018   Lab Results  Component Value Date   CHOL 120 03/23/2020   HDL 39.60 03/23/2020   LDLCALC  52 03/23/2020   TRIG 140.0 03/23/2020   CHOLHDL 3 03/23/2020    Significant Diagnostic Results in last 30 days:  No results found.  Assessment/Plan  1. Essential hypertension Blood pressure  well-controlled -Continue on lisinopril -hydrochlorothiazide - Advised to check Blood pressure at home and  notify provider if B/p > 140/90  - TSH - COMPLETE METABOLIC PANEL WITH GFR - CBC with Differential/Platelet  2. Mixed hyperlipidemia No recent LDL for review -Dietary modification and exercise advised -Continue on atorvastatin - Lipid panel  3. Primary osteoarthritis of both knees Chronic -Continue on acetaminophen -Consider referral to orthopedic if symptoms worsen  4. Prediabetes Lab Results  Component Value Date   HGBA1C 5.8 03/29/2018  Dietary modification and exercise advised - Hemoglobin A1c  5. Headache around the eyes Chronic intermittent -Continue on acetaminophen -Will refer to neurologist if symptoms worsen  6. Vitamin D deficiency Continue on vitamin D supplements - Vitamin D, 1,25-dihydroxy  7. Screen for STD (sexually transmitted disease) Reports no high risk behaviors - HIV Antibody (routine testing w rflx)  8. Age-related osteoporosis without current pathological fracture Previous right femur T-score was - 2.6 -Continue on vitamin D supplements, calcium and Prolia every 6 months.  Last dose of Prolia given in January, 2024 due June, 11 th 2024 Synetta Fail May,CMA advised to schedule for Prolia Injection June,2024   9. OAB (overactive bladder) -Continue on Myrbetriq  Family/ staff Communication: Reviewed plan of care with patient verbalized understanding  Labs/tests ordered:  - CBC with Differential/Platelet - CMP with eGFR([facility]) - TSH - Hgb A1C - Lipid panel - HIV Antibody (routine testing w rflx) - Vitamin D, 1,25-dihydroxy  Next Appointment : Return in about 6 months (around 01/10/2023) for medical mangement of chronic issues., Annual wellness visit soon. Prolia inject 08/12/2022 .   Caesar Bookman, NP

## 2022-07-13 LAB — LIPID PANEL
Cholesterol: 140 mg/dL (ref <200)
HDL: 50 mg/dL (ref >=50)
LDL Cholesterol (Calc): 71 mg/dL (calc)
Non-HDL Cholesterol (Calc): 90 mg/dL (calc) (ref <130)
Total CHOL/HDL Ratio: 2.8 (calc) (ref <5.0)
Triglycerides: 100 mg/dL (ref <150)

## 2022-07-13 LAB — VITAMIN D 1,25 DIHYDROXY
Vitamin D 1, 25 (OH)2 Total: 29 pg/mL (ref 18–72)
Vitamin D2 1, 25 (OH)2: <8 pg/mL
Vitamin D3 1, 25 (OH)2: 29 pg/mL

## 2022-07-13 LAB — HEMOGLOBIN A1C
Hgb A1c MFr Bld: 5.8 % of total Hgb — ABNORMAL HIGH (ref <5.7)
Mean Plasma Glucose: 120 mg/dL
eAG (mmol/L): 6.6 mmol/L

## 2022-07-13 LAB — CBC WITH DIFFERENTIAL/PLATELET
Absolute Monocytes: 239 cells/uL (ref 200–950)
Basophils Absolute: 21 cells/uL (ref 0–200)
Basophils Relative: 0.4 %
Eosinophils Absolute: 68 cells/uL (ref 15–500)
Eosinophils Relative: 1.3 %
HCT: 44.2 % (ref 35.0–45.0)
Hemoglobin: 14.5 g/dL (ref 11.7–15.5)
Lymphs Abs: 2449 cells/uL (ref 850–3900)
MCH: 30.9 pg (ref 27.0–33.0)
MCHC: 32.8 g/dL (ref 32.0–36.0)
MCV: 94.2 fL (ref 80.0–100.0)
MPV: 12.3 fL (ref 7.5–12.5)
Monocytes Relative: 4.6 %
Neutro Abs: 2423 cells/uL (ref 1500–7800)
Neutrophils Relative %: 46.6 %
Platelets: 227 10*3/uL (ref 140–400)
RBC: 4.69 10*6/uL (ref 3.80–5.10)
RDW: 12.9 % (ref 11.0–15.0)
Total Lymphocyte: 47.1 %
WBC: 5.2 10*3/uL (ref 3.8–10.8)

## 2022-07-13 LAB — HIV ANTIBODY (ROUTINE TESTING W REFLEX): HIV 1&2 Ab, 4th Generation: NONREACTIVE

## 2022-07-13 LAB — COMPLETE METABOLIC PANEL WITH GFR
AG Ratio: 1.7 (calc) (ref 1.0–2.5)
ALT: 10 U/L (ref 6–29)
AST: 11 U/L (ref 10–35)
Albumin: 4.5 g/dL (ref 3.6–5.1)
Alkaline phosphatase (APISO): 79 U/L (ref 37–153)
BUN: 10 mg/dL (ref 7–25)
CO2: 27 mmol/L (ref 20–32)
Calcium: 9.9 mg/dL (ref 8.6–10.4)
Chloride: 105 mmol/L (ref 98–110)
Creat: 0.62 mg/dL (ref 0.50–1.05)
Globulin: 2.7 g/dL (calc) (ref 1.9–3.7)
Glucose, Bld: 94 mg/dL (ref 65–99)
Potassium: 4 mmol/L (ref 3.5–5.3)
Sodium: 144 mmol/L (ref 135–146)
Total Bilirubin: 0.8 mg/dL (ref 0.2–1.2)
Total Protein: 7.2 g/dL (ref 6.1–8.1)
eGFR: 99 mL/min/{1.73_m2} (ref >=60)

## 2022-07-13 LAB — TSH: TSH: 1.04 mIU/L (ref 0.40–4.50)

## 2022-07-17 NOTE — Telephone Encounter (Signed)
Submitted to [employer].  Awaiting Verification.

## 2022-07-25 ENCOUNTER — Telehealth: Payer: Self-pay | Admitting: *Deleted

## 2022-07-25 NOTE — Telephone Encounter (Signed)
Prior Authorization APPROVED through [facility] 07/25/2022-07/25/2023 Authorization #: [id number]  Copy sent to scanning.

## 2022-07-31 NOTE — Telephone Encounter (Signed)
-   Discontinue Prolia as requested by patient then start on Fosmax 70 mg tablet one by mouth weekly

## 2022-07-31 NOTE — Telephone Encounter (Addendum)
Prior Authorization approved through insurance. 07/25/2022-07/25/2023 Auth #: [id number] Patient has a 4% copay of $65.00  Patient does not want to go through with Prolia and wants something different that does not cost as much.   Please Advise.

## 2022-08-01 NOTE — Telephone Encounter (Signed)
LMOM to return call.

## 2022-08-04 NOTE — Telephone Encounter (Signed)
LMOM to return call.

## 2022-08-06 MED ORDER — ALENDRONATE SODIUM 70 MG PO TABS: 70.0000 mg | ORAL_TABLET | ORAL | 5 refills | Status: DC

## 2022-08-06 NOTE — Addendum Note (Signed)
Addended by: Nelda Severe A on: 08/06/2022 04:56 PM   Modules accepted: Orders

## 2022-08-06 NOTE — Telephone Encounter (Signed)
Patient notified and agreed.  Rx sent to pharmacy as requested.

## 2022-08-06 NOTE — Telephone Encounter (Signed)
LMOM to return call.

## 2022-08-12 ENCOUNTER — Ambulatory Visit: Payer: 59

## 2022-08-21 ENCOUNTER — Other Ambulatory Visit: Payer: Self-pay

## 2022-08-21 MED ORDER — ALENDRONATE SODIUM 70 MG PO TABS: 70.0000 mg | ORAL_TABLET | ORAL | 3 refills | Status: DC

## 2022-08-21 MED ORDER — ALBUTEROL SULFATE HFA 108 (90 BASE) MCG/ACT IN AERS: INHALATION_SPRAY | RESPIRATORY_TRACT | 3 refills | Status: DC

## 2022-09-17 ENCOUNTER — Other Ambulatory Visit: Payer: Self-pay | Admitting: Family

## 2022-09-17 ENCOUNTER — Ambulatory Visit
Admission: RE | Admit: 2022-09-17 | Discharge: 2022-09-17 | Disposition: A | Discharge status: Home / Self Care | Payer: 59 | Source: Ambulatory Visit | Attending: Family | Admitting: Family

## 2022-09-17 ENCOUNTER — Ambulatory Visit (INDEPENDENT_AMBULATORY_CARE_PROVIDER_SITE_OTHER): Payer: 59 | Admitting: Family

## 2022-09-17 ENCOUNTER — Encounter: Payer: Self-pay | Admitting: Family

## 2022-09-17 VITALS — BP 120/82 | HR 87 | Resp 16 | Ht 67.0 in | Wt 168.8 lb

## 2022-09-17 DIAGNOSIS — M25551 Pain in right hip: Secondary | ICD-10-CM | POA: Diagnosis not present

## 2022-09-17 DIAGNOSIS — M79651 Pain in right thigh: Secondary | ICD-10-CM

## 2022-09-17 MED ORDER — TRAMADOL HCL 50 MG PO TABS: 50.0000 mg | ORAL_TABLET | Freq: Three times a day (TID) | ORAL | 0 refills | Status: AC | PRN

## 2022-09-17 NOTE — Progress Notes (Signed)
Provider: Richarda Blade FNP-C  Anne Palmer, [name], NP  Patient Care Team: [name], [name], NP as PCP - General (Family Medicine)  Extended Emergency Contact Information Primary Emergency Contact: Costanza,Chandris Address: [address]          [place] [address] [place] Home Phone: [phone] Mobile Phone: [phone] Relation: Daughter Secondary Emergency Contact: Crisafulli,Stacy Home Phone: [phone] Mobile Phone: [phone] Relation: Brother Preferred language: English Interpreter needed? No  Code Status:  Full Code  Goals of care: Advanced Directive information    07/10/2022   12:56 PM  Advanced Directives  Does Patient Have a Medical Advance Directive? Yes  Does patient want to make changes to medical advance directive? No - Patient declined     Chief Complaint  Patient presents with   Hip Pain    Right hip x 1 week. Larey Seat on it with all her weight on it on daughters porch. Hurts with any pressure. Didn't have any treatment for it.     HPI:  Pt is a 67 y.o. female seen today for an acute visit for evaluation of Right hip pain  x 1 week. Larey Seat on it with all her weight on it.sh fell on her niece's porch in [place] while visiting.states was used to her other place where they did not have steps.Right hip Hurts with any pressure. She did not go to ED or urgent care.Describes leg as heavy has to lift leg to cross over the left leg. Pain worst on upper thigh/groin area.  Has had to use a cane.states unable to put pressure on the leg.Rates pain 7/10 on scale. No numbness or tingling. Has taken Tylenol without relief.  Past Medical History:  Diagnosis Date   Arthritis    Diverticulitis 2015   Hematuria    Hypertension    Past Surgical History:  Procedure Laterality Date   ABDOMINAL HYSTERECTOMY     secondary to endometriosis and fibroids   COLONOSCOPY  2015   no polyps, diverticulosis   SHOULDER SURGERY  2008   left    Allergies   Allergen Reactions   Sulfa Antibiotics Hives    Outpatient Encounter Medications as of 09/17/2022  Medication Sig   acetaminophen (TYLENOL) 500 MG tablet Take 500 mg by mouth every 6 (six) hours as needed.   albuterol (VENTOLIN HFA) 108 (90 Base) MCG/ACT inhaler USE 2 INHALATIONS BY MOUTH  EVERY 6 HOURS AS NEEDED FOR WHEEZING OR SHORTNESS OF  BREATH   alendronate (FOSAMAX) 70 MG tablet Take 1 tablet (70 mg total) by mouth once a week. Take with a full glass of water on an empty stomach.   atorvastatin (LIPITOR) 20 MG tablet Take 20 mg by mouth daily.   calcium carbonate (CALCIUM 600) 600 MG TABS tablet Take 1 tablet (600 mg total) by mouth 2 (two) times daily with a meal.   lisinopril-hydrochlorothiazide (ZESTORETIC) 20-25 MG tablet TAKE 1 TABLET BY MOUTH DAILY   MYRBETRIQ 50 MG TB24 tablet Take 50 mg by mouth daily.   No facility-administered encounter medications on file as of 09/17/2022.    Review of Systems  Constitutional:  Negative for appetite change, chills, fatigue and fever.  Respiratory:  Negative for cough, chest tightness, shortness of breath and wheezing.   Cardiovascular:  Negative for chest pain, palpitations and leg swelling.  Gastrointestinal:  Negative for abdominal distention, abdominal pain, nausea and vomiting.  Musculoskeletal:  Positive for arthralgias and gait problem. Negative for joint swelling.  Right hip/thigh pain post fall   Skin:  Negative for color change, pallor, rash and wound.  Neurological:  Negative for dizziness, syncope, weakness, light-headedness, numbness and headaches.  Hematological:  Does not bruise/bleed easily.     There is no immunization history on file for this patient. Pertinent  Health Maintenance Due  Topic Date Due   INFLUENZA VACCINE  10/02/2022   Colonoscopy  04/23/2023   MAMMOGRAM  08/28/2023   DEXA SCAN  Completed      03/07/2016    9:11 AM 12/09/2016   10:19 AM 07/10/2022   12:56 PM  Fall Risk  Falls in the past  year? No No 0  Was there an injury with Fall?   0  Fall Risk Category Calculator   0  Patient at Risk for Falls Due to   No Fall Risks  Fall risk Follow up   Falls evaluation completed   Functional Status Survey: Is the patient deaf or have difficulty hearing?: No Does the patient have difficulty seeing, even when wearing glasses/contacts?: No Does the patient have difficulty concentrating, remembering, or making decisions?: No Does the patient have difficulty walking or climbing stairs?: Yes Does the patient have difficulty dressing or bathing?: No Does the patient have difficulty doing errands alone such as visiting a doctor's office or shopping?: No  Vitals:   09/17/22 1054  BP: 120/82  Pulse: 87  Resp: 16  SpO2: 98%  Weight: 168 lb 12.8 oz (76.6 kg)  Height: 5\' 7"  (1.702 m)   Body mass index is 26.44 kg/m.  Physical Exam Vitals reviewed.  Constitutional:      General: She is not in acute distress.    Appearance: Normal appearance. She is overweight. She is not ill-appearing or diaphoretic.  HENT:     Head: Normocephalic.     Right Ear: Tympanic membrane, ear canal and external ear normal. There is no impacted cerumen.     Left Ear: Tympanic membrane, ear canal and external ear normal. There is no impacted cerumen.     Nose: Nose normal. No congestion or rhinorrhea.     Mouth/Throat:     Mouth: Mucous membranes are moist.     Pharynx: Oropharynx is clear. No oropharyngeal exudate or posterior oropharyngeal erythema.  Eyes:     General: No scleral icterus.       Right eye: No discharge.        Left eye: No discharge.     Extraocular Movements: Extraocular movements intact.     Conjunctiva/sclera: Conjunctivae normal.     Pupils: Pupils are equal, round, and reactive to light.  Neck:     Vascular: No carotid bruit.  Cardiovascular:     Rate and Rhythm: Normal rate and regular rhythm.     Pulses: Normal pulses.     Heart sounds: Normal heart sounds. No murmur  heard.    No friction rub. No gallop.  Pulmonary:     Effort: Pulmonary effort is normal. No respiratory distress.     Breath sounds: Normal breath sounds. No wheezing, rhonchi or rales.  Chest:     Chest wall: No tenderness.  Abdominal:     General: Bowel sounds are normal. There is no distension.     Palpations: Abdomen is soft. There is no mass.     Tenderness: There is no abdominal tenderness. There is no right CVA tenderness, left CVA tenderness, guarding or rebound.  Musculoskeletal:        General: No swelling. Normal  range of motion.     Cervical back: Normal range of motion. No rigidity or tenderness.     Right hip: Tenderness present. No crepitus. Normal range of motion. Normal strength.     Left hip: Normal.     Right upper leg: Tenderness present. No swelling or edema.     Left upper leg: Normal.     Right lower leg: No edema.     Left lower leg: No edema.  Lymphadenopathy:     Cervical: No cervical adenopathy.  Skin:    General: Skin is warm and dry.     Coloration: Skin is not pale.     Findings: No bruising, erythema, lesion or rash.  Neurological:     Mental Status: She is alert and oriented to person, place, and time.     Cranial Nerves: No cranial nerve deficit.     Sensory: No sensory deficit.     Motor: No weakness.     Coordination: Coordination normal.     Gait: Gait abnormal.  Psychiatric:        Mood and Affect: Mood normal.        Speech: Speech normal.        Behavior: Behavior normal.     Labs reviewed: Recent Labs    07/10/22 1425  NA 144  K 4.0  CL 105  CO2 27  GLUCOSE 94  BUN 10  CREATININE 0.62  CALCIUM 9.9   Recent Labs    07/10/22 1425  AST 11  ALT 10  BILITOT 0.8  PROT 7.2   Recent Labs    07/10/22 1425  WBC 5.2  NEUTROABS 2,423  HGB 14.5  HCT 44.2  MCV 94.2  PLT 227   Lab Results  Component Value Date   TSH 1.04 07/10/2022   Lab Results  Component Value Date   HGBA1C 5.8 (H) 07/10/2022   Lab Results   Component Value Date   CHOL 140 07/10/2022   HDL 50 07/10/2022   LDLCALC 71 07/10/2022   TRIG 100 07/10/2022   CHOLHDL 2.8 07/10/2022    Significant Diagnostic Results in last 30 days:  No results found.  Assessment/Plan  1. Acute right hip pain Right hip/groin and upper thigh tender to palpation  - start on Tramadol  - traMADol (ULTRAM) 50 MG tablet; Take 1 tablet (50 mg total) by mouth every 8 (eight) hours as needed for up to 5 days.  Dispense: 15 tablet; Refill: 0  2. Acute pain of right thigh Tender to palpation  PDMP reviewed  - start on Tramadol for pain as below  - traMADol (ULTRAM) 50 MG tablet; Take 1 tablet (50 mg total) by mouth every 8 (eight) hours as needed for up to 5 days.  Dispense: 15 tablet; Refill: 0  Family/ staff Communication: Reviewed plan of care with patient verbalized understanding   Labs/tests ordered: None   Next Appointment: Return if symptoms worsen or fail to improve.   Caesar Bookman, NP

## 2022-09-17 NOTE — Patient Instructions (Signed)
-   Please get right hip/thigh X-ray at [facility] imaging at [facility] then will call you with results.

## 2022-09-29 ENCOUNTER — Other Ambulatory Visit: Payer: Self-pay

## 2022-09-29 ENCOUNTER — Other Ambulatory Visit: Payer: Self-pay | Admitting: Family

## 2022-09-29 DIAGNOSIS — M25551 Pain in right hip: Secondary | ICD-10-CM

## 2022-09-29 DIAGNOSIS — M79651 Pain in right thigh: Secondary | ICD-10-CM

## 2022-10-10 ENCOUNTER — Other Ambulatory Visit: Payer: Self-pay

## 2022-10-10 ENCOUNTER — Ambulatory Visit: Payer: 59 | Admitting: Sports Medicine

## 2022-10-10 ENCOUNTER — Encounter: Payer: Self-pay | Admitting: Sports Medicine

## 2022-10-10 DIAGNOSIS — W19XXXD Unspecified fall, subsequent encounter: Secondary | ICD-10-CM

## 2022-10-10 DIAGNOSIS — M25551 Pain in right hip: Secondary | ICD-10-CM

## 2022-10-10 DIAGNOSIS — M1611 Unilateral primary osteoarthritis, right hip: Secondary | ICD-10-CM | POA: Diagnosis not present

## 2022-10-10 MED ORDER — LIDOCAINE HCL 1 % IJ SOLN
4.0000 mL | INTRAMUSCULAR | Status: AC | PRN
Start: 2022-10-10 — End: 2022-10-10
  Administered 2022-10-10: 4 mL

## 2022-10-10 MED ORDER — METHYLPREDNISOLONE ACETATE 40 MG/ML IJ SUSP
80.0000 mg | INTRAMUSCULAR | Status: AC | PRN
Start: 2022-10-10 — End: 2022-10-10
  Administered 2022-10-10: 80 mg via INTRA_ARTICULAR

## 2022-10-10 NOTE — Progress Notes (Signed)
Right hip pain since the fourth of July after she fell. States she has had xrays which are in the [facility] system. She is having groin pain aswell. States she has an injection in her hip many years ago here.

## 2022-10-10 NOTE — Progress Notes (Signed)
Anne Palmer - 67 y.o. female MRN [id number]  Date of birth: 06-04-1955  Office Visit Note: Visit Date: 10/10/2022 PCP: Caesar Bookman, NP Referred by: Caesar Bookman, NP  Subjective: Chief Complaint  Patient presents with   Right Hip - Pain   HPI: Anne Palmer is a pleasant 67 y.o. female who presents today for acute on chronic right hip pain. Worsened s/p fall onto leg/hip in July.  Patient had a fall back in early July when she was checking on her daughter's new deck and missed the step.  She fell onto the right side of the hip.  She was able to walk after this.  Since then her pain has remained, it continues in the groin of the hip.  One of her medical providers did provide her some tramadol to take for breakthrough pain.  She is also taking 2 Tylenol, only minimal relief.  Has not done any therapy yet.  She does report that many years ago she did have an intra-articular hip injection with settle down her pain very well.  Pertinent ROS were reviewed with the patient and found to be negative unless otherwise specified above in HPI.   Assessment & Plan: Visit Diagnoses:  1. Unilateral primary osteoarthritis, right hip   2. Pain in right hip   3. Fall, subsequent encounter    Plan: Discussed with Meagan the nature of her right hip pain.  Reviewed her x-rays which do not show any acute bony fracture.  She does have some mild arthritis with early signs of hip impingement.  I believe her fall exacerbated her chronic underlying arthritic change as she does have pain that is reproducible in the groin.  Discussed all treatment options such as medication, physical therapy, injection therapy.  She had good relief from the injection years ago, we did repeat ultrasound-guided right intra-articular hip injection, patient tolerated well.  She will allow for 48 hours of modified rest and activity.  May use Tylenol, heat for any postinjection pain.  She does have tramadol 50 mg which she  will take twice daily as needed, we did discuss discontinuing this given the side effects.  She is not interested in physical therapy at this time, we can consider this in the future if the injection does not further settle down her pain.  She will follow-up with me as needed.  Follow-up: Return if symptoms worsen or fail to improve.   Meds & Orders: No orders of the defined types were placed in this encounter.   Orders Placed This Encounter  Procedures   Large Joint Inj   US Guided Needle Placement - No Linked Charges     Procedures: Large Joint Inj: R hip joint on 10/10/2022 9:15 AM Indications: pain Details: 22 G 3.5 in needle, ultrasound-guided anterior approach Medications: 4 mL lidocaine 1 %; 80 mg methylPREDNISolone acetate 40 MG/ML Outcome: tolerated well, no immediate complications  Procedure: US-guided intra-articular hip injection, right After discussion on risks/benefits/indications and informed verbal consent was obtained, a timeout was performed. Patient was lying supine on exam table. The hip was cleaned with betadine and alcohol swabs. Then utilizing ultrasound guidance, the patient's femoral head and neck junction was identified and subsequently injected with 4:2 lidocaine:depomedrol via an in-plane approach with ultrasound visualization of the injectate administered into the hip joint. Patient tolerated procedure well without immediate complications.  Procedure, treatment alternatives, risks and benefits explained, specific risks discussed. Consent was given by the patient. Immediately prior to procedure  a time out was called to verify the correct patient, procedure, equipment, support staff and site/side marked as required. Patient was prepped and draped in the usual sterile fashion.          Clinical History: No specialty comments available.  She reports that she has been smoking cigarettes. She has never used smokeless tobacco.  Recent Labs    07/10/22 1425   HGBA1C 5.8*    Objective:   Vital Signs: There were no vitals taken for this visit.  Physical Exam  Gen: Well-appearing, in no acute distress; non-toxic CV: Well-perfused. Warm.  Resp: Breathing unlabored on room air; no wheezing. Psych: Fluid speech in conversation; appropriate affect; normal thought process Neuro: Sensation intact throughout. No gross coordination deficits.   Ortho Exam - Right hip: No bony TTP, no redness or overlying skin changes.  There is relatively well-preserved internal and external logroll without bony restriction.  There is pain associated with positive FADIR, negative FABER.  Imaging:  *3 views of the right femur including AP lateral proximal and distal views were independently reviewed and interpreted by myself today.  X-rays demonstrate mild osteoarthritic change of the right hip with a small overhanging acetabular rim, could predispose to impingement.  There is mild sclerosis of the greater tuberosity on the lateral right hip.  No acute bony fracture.  No significant arthritic change about the hip on the lateral views.  DG FEMUR, MIN 2 VIEWS RIGHT CLINICAL DATA:  Right upper thigh pain after fall. Hip and mid thigh pain.  EXAM: RIGHT FEMUR 2 VIEWS  COMPARISON:  Hip radiograph 02/12/2022, knee radiograph 10/30/2021  FINDINGS: No acute or healing femur fracture. No erosive change or periostitis. Benign sclerotic lesion in the distal femoral diaphysis is unchanged from prior knee radiograph. The hip joint space is preserved, femoral head is well seated. Minor lateral acetabular spurring. Right pubic rami are intact. Knee alignment is preserved. Vascular calcifications are seen.  IMPRESSION: 1. No acute or healing femur fracture. 2. Minor degenerative change of the right hip.  Electronically Signed   By: Narda Rutherford M.D.   On: 09/26/2022 20:11   Past Medical/Family/Surgical/Social History: Medications & Allergies reviewed per EMR, new  medications updated. Patient Active Problem List   Diagnosis Date Noted   Age-related osteoporosis without current pathological fracture 07/10/2022   OAB (overactive bladder) 07/10/2022   Vitamin D deficiency 07/10/2022   Primary osteoarthritis of both knees 09/27/2018   Patellofemoral syndrome 06/30/2017   Hyperlipidemia 12/09/2016   Osteopenia 12/09/2016   Upper back pain, chronic 12/09/2016   Chronic sinusitis 06/05/2016   Essential hypertension 03/07/2016   Headache around the eyes 03/07/2016   OA (osteoarthritis) of knee 03/07/2016   Postmenopausal state 03/07/2016   Prediabetes 03/07/2016   Tobacco use disorder 03/07/2016   Past Medical History:  Diagnosis Date   Arthritis    Diverticulitis 2015   Hematuria    Hypertension    Family History  Problem Relation Age of Onset   Arthritis Mother    Stroke Mother    Hypertension Mother    Diabetes Mother    Alcohol abuse Father    Arthritis Sister    Hyperlipidemia Sister    Diabetes Sister    Lupus Sister    Arthritis Brother    Diabetes Brother    Past Surgical History:  Procedure Laterality Date   ABDOMINAL HYSTERECTOMY     secondary to endometriosis and fibroids   COLONOSCOPY  2015   no polyps, diverticulosis  SHOULDER SURGERY  2008   left   Social History   Occupational History   Not on file  Tobacco Use   Smoking status: Every Day    Current packs/day: 0.25    Types: Cigarettes   Smokeless tobacco: Never  Substance and Sexual Activity   Alcohol use: Yes    Comment: occasional   Drug use: No   Sexual activity: Not Currently    Birth control/protection: None

## 2023-01-02 DIAGNOSIS — H524 Presbyopia: Secondary | ICD-10-CM | POA: Diagnosis not present

## 2023-01-02 DIAGNOSIS — H2513 Age-related nuclear cataract, bilateral: Secondary | ICD-10-CM | POA: Diagnosis not present

## 2023-01-07 ENCOUNTER — Other Ambulatory Visit: Payer: Self-pay | Admitting: Family

## 2023-01-12 ENCOUNTER — Ambulatory Visit (INDEPENDENT_AMBULATORY_CARE_PROVIDER_SITE_OTHER): Payer: 59 | Admitting: Family

## 2023-01-12 ENCOUNTER — Encounter: Payer: Self-pay | Admitting: Family

## 2023-01-12 VITALS — BP 136/82 | HR 65 | Resp 20 | Ht 67.0 in | Wt 175.0 lb

## 2023-01-12 DIAGNOSIS — E559 Vitamin D deficiency, unspecified: Secondary | ICD-10-CM | POA: Diagnosis not present

## 2023-01-12 DIAGNOSIS — I10 Essential (primary) hypertension: Secondary | ICD-10-CM

## 2023-01-12 DIAGNOSIS — E782 Mixed hyperlipidemia: Secondary | ICD-10-CM | POA: Diagnosis not present

## 2023-01-12 DIAGNOSIS — Z1211 Encounter for screening for malignant neoplasm of colon: Secondary | ICD-10-CM | POA: Diagnosis not present

## 2023-01-12 DIAGNOSIS — M170 Bilateral primary osteoarthritis of knee: Secondary | ICD-10-CM

## 2023-01-12 DIAGNOSIS — R7303 Prediabetes: Secondary | ICD-10-CM | POA: Diagnosis not present

## 2023-01-12 DIAGNOSIS — Z23 Encounter for immunization: Secondary | ICD-10-CM | POA: Diagnosis not present

## 2023-01-12 NOTE — Addendum Note (Signed)
Addended by: Margo Common on: 01/12/2023 09:39 AM   Modules accepted: Orders

## 2023-01-12 NOTE — Patient Instructions (Signed)
Please get Tdap and shingles vaccine at the Pharmacy.

## 2023-01-12 NOTE — Progress Notes (Signed)
Provider: Richarda Blade FNP-C   Nansi Birmingham, [name], NP  Patient Care Team: Emmalea Treanor, [name], NP as PCP - General (Family Medicine)  Extended Emergency Contact Information Primary Emergency Contact: Johanson,Chandris Address: [address]          [place] [address] [place] Home Phone: [phone] Mobile Phone: [phone] Relation: Daughter Secondary Emergency Contact: Bram,Stacy Home Phone: [phone] Mobile Phone: [phone] Relation: Brother Preferred language: English Interpreter needed? No  Code Status:  Full Code  Goals of care: Advanced Directive information    07/10/2022   12:56 PM  Advanced Directives  Does Patient Have a Medical Advance Directive? Yes  Does patient want to make changes to medical advance directive? No - Patient declined     Chief Complaint  Patient presents with   Medical Management of Chronic Issues    6 months follow up     HPI:  Pt is a 67 y.o. female seen today for 6 months follow up for medical management of chronic diseases.    Hypertension - B/p elevated this visit.No home B/p log for review.she denies any headache,dizziness,vision changes,fatigue,chest tightness,palpitation,chest pain or shortness of breath.     Hyperlipidemia - Previous LDL at goal on Atorvastatin 20 mg No muscle aches or weakness reported.walks 3 mils daily.  Osteoarthritis - Had cortisol injection.States pain resolved.feels good.Has been walking at least 3 miles daily.    Prediabetes - previous A1C was 5.8 has cut out on the sodas.    Past Medical History:  Diagnosis Date   Arthritis    Diverticulitis 2015   Hematuria    Hypertension    Past Surgical History:  Procedure Laterality Date   ABDOMINAL HYSTERECTOMY     secondary to endometriosis and fibroids   COLONOSCOPY  2015   no polyps, diverticulosis   SHOULDER SURGERY  2008   left    Allergies  Allergen Reactions   Sulfa Antibiotics Hives    Allergies as of  01/12/2023       Reactions   Sulfa Antibiotics Hives        Medication List        Accurate as of January 12, 2023  9:34 AM. If you have any questions, ask your nurse or doctor.          acetaminophen 500 MG tablet Commonly known as: TYLENOL Take 500 mg by mouth every 6 (six) hours as needed.   albuterol 108 (90 Base) MCG/ACT inhaler Commonly known as: VENTOLIN HFA USE 2 INHALATIONS BY MOUTH  EVERY 6 HOURS AS NEEDED FOR WHEEZING OR SHORTNESS OF  BREATH   alendronate 70 MG tablet Commonly known as: FOSAMAX TAKE 1 TABLET BY MOUTH ONCE A WEEK TAKE  WITH  A  FULL  GLAS  OF  WATER  ON  AN  EMPTY  STOMACH   atorvastatin 20 MG tablet Commonly known as: LIPITOR Take 20 mg by mouth daily.   calcium carbonate 600 MG Tabs tablet Commonly known as: Calcium 600 Take 1 tablet (600 mg total) by mouth 2 (two) times daily with a meal.   lisinopril-hydrochlorothiazide 20-25 MG tablet Commonly known as: ZESTORETIC TAKE 1 TABLET BY MOUTH DAILY   Myrbetriq 50 MG Tb24 tablet Generic drug: mirabegron ER Take 50 mg by mouth daily.        Review of Systems  Constitutional:  Negative for appetite change, chills, fatigue, fever and unexpected weight change.  HENT:  Negative for congestion, dental problem, ear discharge, ear pain, facial  swelling, hearing loss, nosebleeds, postnasal drip, rhinorrhea, sinus pressure, sinus pain, sneezing, sore throat, tinnitus and trouble swallowing.   Eyes:  Negative for pain, discharge, redness, itching and visual disturbance.  Respiratory:  Negative for cough, chest tightness, shortness of breath and wheezing.   Cardiovascular:  Negative for chest pain, palpitations and leg swelling.  Gastrointestinal:  Negative for abdominal distention, abdominal pain, blood in stool, constipation, diarrhea, nausea and vomiting.  Endocrine: Negative for cold intolerance, heat intolerance, polydipsia, polyphagia and polyuria.  Genitourinary:  Negative for difficulty  urinating, dysuria, flank pain, frequency and urgency.  Musculoskeletal:  Negative for arthralgias, back pain, gait problem, joint swelling, myalgias, neck pain and neck stiffness.  Skin:  Negative for color change, pallor, rash and wound.  Neurological:  Negative for dizziness, syncope, speech difficulty, weakness, light-headedness, numbness and headaches.  Hematological:  Does not bruise/bleed easily.  Psychiatric/Behavioral:  Negative for agitation, behavioral problems, confusion, hallucinations, self-injury, sleep disturbance and suicidal ideas. The patient is not nervous/anxious.     Immunization History  Administered Date(s) Administered   Pneumococcal-Unspecified 10/25/2021   Pertinent  Health Maintenance Due  Topic Date Due   Colonoscopy  04/23/2023   INFLUENZA VACCINE  06/01/2023 (Originally 10/02/2022)   MAMMOGRAM  08/28/2023   DEXA SCAN  Completed      03/07/2016    9:11 AM 12/09/2016   10:19 AM 07/10/2022   12:56 PM 01/12/2023    8:47 AM  Fall Risk  Falls in the past year? No No 0 0  Was there an injury with Fall?   0 0  Fall Risk Category Calculator   0 0  Patient at Risk for Falls Due to   No Fall Risks No Fall Risks  Fall risk Follow up   Falls evaluation completed Falls evaluation completed   Functional Status Survey:    Vitals:   01/12/23 0839 01/12/23 0850  BP: (!) 158/100 136/82  Pulse: 65   Resp: 20   SpO2: 98%   Weight: 175 lb (79.4 kg)   Height: 5\' 7"  (1.702 m)    Body mass index is 27.41 kg/m. Physical Exam Vitals reviewed.  Constitutional:      General: She is not in acute distress.    Appearance: Normal appearance. She is overweight. She is not ill-appearing or diaphoretic.  HENT:     Head: Normocephalic.     Right Ear: Tympanic membrane, ear canal and external ear normal. There is no impacted cerumen.     Left Ear: Tympanic membrane, ear canal and external ear normal. There is no impacted cerumen.     Nose: Nose normal. No congestion or  rhinorrhea.     Mouth/Throat:     Mouth: Mucous membranes are moist.     Pharynx: Oropharynx is clear. No oropharyngeal exudate or posterior oropharyngeal erythema.  Eyes:     General: No scleral icterus.       Right eye: No discharge.        Left eye: No discharge.     Extraocular Movements: Extraocular movements intact.     Conjunctiva/sclera: Conjunctivae normal.     Pupils: Pupils are equal, round, and reactive to light.  Neck:     Vascular: No carotid bruit.  Cardiovascular:     Rate and Rhythm: Normal rate and regular rhythm.     Pulses: Normal pulses.     Heart sounds: Normal heart sounds. No murmur heard.    No friction rub. No gallop.  Pulmonary:     Effort:  Pulmonary effort is normal. No respiratory distress.     Breath sounds: Normal breath sounds. No wheezing, rhonchi or rales.  Chest:     Chest wall: No tenderness.  Abdominal:     General: Bowel sounds are normal. There is no distension.     Palpations: Abdomen is soft. There is no mass.     Tenderness: There is no abdominal tenderness. There is no right CVA tenderness, left CVA tenderness, guarding or rebound.  Musculoskeletal:        General: No swelling or tenderness. Normal range of motion.     Cervical back: Normal range of motion. No rigidity or tenderness.     Right lower leg: No edema.     Left lower leg: No edema.  Lymphadenopathy:     Cervical: No cervical adenopathy.  Skin:    General: Skin is warm and dry.     Coloration: Skin is not pale.     Findings: No bruising, erythema, lesion or rash.  Neurological:     Mental Status: She is alert and oriented to person, place, and time.     Cranial Nerves: No cranial nerve deficit.     Sensory: No sensory deficit.     Motor: No weakness.     Coordination: Coordination normal.     Gait: Gait normal.  Psychiatric:        Mood and Affect: Mood normal.        Speech: Speech normal.        Behavior: Behavior normal.        Thought Content: Thought content  normal.        Judgment: Judgment normal.     Labs reviewed: Recent Labs    07/10/22 1425  NA 144  K 4.0  CL 105  CO2 27  GLUCOSE 94  BUN 10  CREATININE 0.62  CALCIUM 9.9   Recent Labs    07/10/22 1425  AST 11  ALT 10  BILITOT 0.8  PROT 7.2   Recent Labs    07/10/22 1425  WBC 5.2  NEUTROABS 2,423  HGB 14.5  HCT 44.2  MCV 94.2  PLT 227   Lab Results  Component Value Date   TSH 1.04 07/10/2022   Lab Results  Component Value Date   HGBA1C 5.8 (H) 07/10/2022   Lab Results  Component Value Date   CHOL 140 07/10/2022   HDL 50 07/10/2022   LDLCALC 71 07/10/2022   TRIG 100 07/10/2022   CHOLHDL 2.8 07/10/2022    Significant Diagnostic Results in last 30 days:  No results found.  Assessment/Plan  1. Essential hypertension B/p elevated on arrival though has not taken her medication.she will take medication after visit. - continue on Lisinopril- Hydrochlorothiazide   2. Mixed hyperlipidemia LDL at goal  - continue on Atorvastatin  - continue with dietary modification and exercise   3. Prediabetes Lab Results  Component Value Date   HGBA1C 5.8 (H) 07/10/2022  -dietary modification and exercise advised   4. Primary osteoarthritis of both knees Continue on Tylenol as needed   Family/ staff Communication: Reviewed plan of care with patient verbalized understanding   Labs/tests ordered:  - CBC with Differential/Platelet - CMP with eGFR([facility]) - TSH - Hgb A1C - Lipid panel  Next Appointment : Return in about 6 months (around 07/12/2023) for medical mangement of chronic issues. Annual wellness visit soon.   Caesar Bookman, NP

## 2023-01-13 LAB — COMPLETE METABOLIC PANEL WITH GFR
AG Ratio: 1.7 (calc) (ref 1.0–2.5)
ALT: 15 U/L (ref 6–29)
AST: 13 U/L (ref 10–35)
Albumin: 4.4 g/dL (ref 3.6–5.1)
Alkaline phosphatase (APISO): 79 U/L (ref 37–153)
BUN: 12 mg/dL (ref 7–25)
CO2: 30 mmol/L (ref 20–32)
Calcium: 9.4 mg/dL (ref 8.6–10.4)
Chloride: 105 mmol/L (ref 98–110)
Creat: 0.61 mg/dL (ref 0.50–1.05)
Globulin: 2.6 g/dL (ref 1.9–3.7)
Glucose, Bld: 99 mg/dL (ref 65–99)
Potassium: 4.6 mmol/L (ref 3.5–5.3)
Sodium: 143 mmol/L (ref 135–146)
Total Bilirubin: 0.6 mg/dL (ref 0.2–1.2)
Total Protein: 7 g/dL (ref 6.1–8.1)
eGFR: 99 mL/min/{1.73_m2} (ref >=60)

## 2023-01-13 LAB — CBC WITH DIFFERENTIAL/PLATELET
Absolute Lymphocytes: 1945 {cells}/uL (ref 850–3900)
Absolute Monocytes: 260 {cells}/uL (ref 200–950)
Basophils Absolute: 29 {cells}/uL (ref 0–200)
Basophils Relative: 0.6 %
Eosinophils Absolute: 49 {cells}/uL (ref 15–500)
Eosinophils Relative: 1 %
HCT: 44.6 % (ref 35.0–45.0)
Hemoglobin: 14.7 g/dL (ref 11.7–15.5)
MCH: 31.6 pg (ref 27.0–33.0)
MCHC: 33 g/dL (ref 32.0–36.0)
MCV: 95.9 fL (ref 80.0–100.0)
MPV: 12.4 fL (ref 7.5–12.5)
Monocytes Relative: 5.3 %
Neutro Abs: 2617 {cells}/uL (ref 1500–7800)
Neutrophils Relative %: 53.4 %
Platelets: 196 10*3/uL (ref 140–400)
RBC: 4.65 10*6/uL (ref 3.80–5.10)
RDW: 13 % (ref 11.0–15.0)
Total Lymphocyte: 39.7 %
WBC: 4.9 10*3/uL (ref 3.8–10.8)

## 2023-01-13 LAB — TSH: TSH: 1.06 m[IU]/L (ref 0.40–4.50)

## 2023-01-13 LAB — LIPID PANEL
Cholesterol: 120 mg/dL (ref <200)
HDL: 52 mg/dL (ref >=50)
LDL Cholesterol (Calc): 49 mg/dL
Non-HDL Cholesterol (Calc): 68 mg/dL (ref <130)
Total CHOL/HDL Ratio: 2.3 (calc) (ref <5.0)
Triglycerides: 106 mg/dL (ref <150)

## 2023-01-13 LAB — HEMOGLOBIN A1C
Hgb A1c MFr Bld: 5.8 %{Hb} — ABNORMAL HIGH (ref <5.7)
Mean Plasma Glucose: 120 mg/dL
eAG (mmol/L): 6.6 mmol/L

## 2023-01-14 ENCOUNTER — Other Ambulatory Visit: Payer: Self-pay

## 2023-01-14 DIAGNOSIS — R7303 Prediabetes: Secondary | ICD-10-CM

## 2023-01-27 ENCOUNTER — Encounter: Payer: Self-pay | Admitting: Family

## 2023-01-27 ENCOUNTER — Ambulatory Visit: Payer: 59 | Admitting: Family

## 2023-01-27 DIAGNOSIS — Z0000 Encounter for general adult medical examination without abnormal findings: Secondary | ICD-10-CM | POA: Diagnosis not present

## 2023-01-27 DIAGNOSIS — F17200 Nicotine dependence, unspecified, uncomplicated: Secondary | ICD-10-CM | POA: Diagnosis not present

## 2023-01-27 MED ORDER — BUPROPION HCL ER (SR) 150 MG PO TB12: 150.0000 mg | ORAL_TABLET | Freq: Every day | ORAL | 0 refills | Status: DC

## 2023-01-27 NOTE — Patient Instructions (Signed)
Anne Palmer , Thank you for taking time to come for your Medicare Wellness Visit. I appreciate your ongoing commitment to your health goals. Please review the following plan we discussed and let me know if I can assist you in the future.   Screening recommendations/referrals: Colonoscopy : Due  Mammogram : Up to date  Bone Density : Up to date  Recommended yearly ophthalmology/optometry visit for glaucoma screening and checkup Recommended yearly dental visit for hygiene and checkup  Vaccinations: Influenza vaccine- due annually in September/October Pneumococcal vaccine : Up to date  Tdap vaccine : Due  Shingles vaccine: Due     Advanced directives: No   Conditions/risks identified: advanced age (>15men, >52 women);family history of premature cardiovascular disease;hypertension;smoking/ tobacco exposure  Next appointment: 1 year    Preventive Care 81 Years and Older, Female Preventive care refers to lifestyle choices and visits with your health care provider that can promote health and wellness. What does preventive care include? A yearly physical exam. This is also called an annual well check. Dental exams once or twice a year. Routine eye exams. Ask your health care provider how often you should have your eyes checked. Personal lifestyle choices, including: Daily care of your teeth and gums. Regular physical activity. Eating a healthy diet. Avoiding tobacco and drug use. Limiting alcohol use. Practicing safe sex. Taking low-dose aspirin every day. Taking vitamin and mineral supplements as recommended by your health care provider. What happens during an annual well check? The services and screenings done by your health care provider during your annual well check will depend on your age, overall health, lifestyle risk factors, and family history of disease. Counseling  Your health care provider may ask you questions about your: Alcohol use. Tobacco use. Drug use. Emotional  well-being. Home and relationship well-being. Sexual activity. Eating habits. History of falls. Memory and ability to understand (cognition). Work and work Astronomer. Reproductive health. Screening  You may have the following tests or measurements: Height, weight, and BMI. Blood pressure. Lipid and cholesterol levels. These may be checked every 5 years, or more frequently if you are over 37 years old. Skin check. Lung cancer screening. You may have this screening every year starting at age 89 if you have a 30-pack-year history of smoking and currently smoke or have quit within the past 15 years. Fecal occult blood test (FOBT) of the stool. You may have this test every year starting at age 47. Flexible sigmoidoscopy or colonoscopy. You may have a sigmoidoscopy every 5 years or a colonoscopy every 10 years starting at age 22. Hepatitis C blood test. Hepatitis B blood test. Sexually transmitted disease (STD) testing. Diabetes screening. This is done by checking your blood sugar (glucose) after you have not eaten for a while (fasting). You may have this done every 1-3 years. Bone density scan. This is done to screen for osteoporosis. You may have this done starting at age 92. Mammogram. This may be done every 1-2 years. Talk to your health care provider about how often you should have regular mammograms. Talk with your health care provider about your test results, treatment options, and if necessary, the need for more tests. Vaccines  Your health care provider may recommend certain vaccines, such as: Influenza vaccine. This is recommended every year. Tetanus, diphtheria, and acellular pertussis (Tdap, Td) vaccine. You may need a Td booster every 10 years. Zoster vaccine. You may need this after age 66. Pneumococcal 13-valent conjugate (PCV13) vaccine. One dose is recommended after age 73.  Pneumococcal polysaccharide (PPSV23) vaccine. One dose is recommended after age 45. Talk to your  health care provider about which screenings and vaccines you need and how often you need them. This information is not intended to replace advice given to you by your health care provider. Make sure you discuss any questions you have with your health care provider. Document Released: 03/16/2015 Document Revised: 11/07/2015 Document Reviewed: 12/19/2014 Elsevier Interactive Patient Education  2017 [employer].  Fall Prevention in the Home Falls can cause injuries. They can happen to people of all ages. There are many things you can do to make your home safe and to help prevent falls. What can I do on the outside of my home? Regularly fix the edges of walkways and driveways and fix any cracks. Remove anything that might make you trip as you walk through a door, such as a raised step or threshold. Trim any bushes or trees on the path to your home. Use bright outdoor lighting. Clear any walking paths of anything that might make someone trip, such as rocks or tools. Regularly check to see if handrails are loose or broken. Make sure that both sides of any steps have handrails. Any raised decks and porches should have guardrails on the edges. Have any leaves, snow, or ice cleared regularly. Use sand or salt on walking paths during winter. Clean up any spills in your garage right away. This includes oil or grease spills. What can I do in the bathroom? Use night lights. Install grab bars by the toilet and in the tub and shower. Do not use towel bars as grab bars. Use non-skid mats or decals in the tub or shower. If you need to sit down in the shower, use a plastic, non-slip stool. Keep the floor dry. Clean up any water that spills on the floor as soon as it happens. Remove soap buildup in the tub or shower regularly. Attach bath mats securely with double-sided non-slip rug tape. Do not have throw rugs and other things on the floor that can make you trip. What can I do in the bedroom? Use night  lights. Make sure that you have a light by your bed that is easy to reach. Do not use any sheets or blankets that are too big for your bed. They should not hang down onto the floor. Have a firm chair that has side arms. You can use this for support while you get dressed. Do not have throw rugs and other things on the floor that can make you trip. What can I do in the kitchen? Clean up any spills right away. Avoid walking on wet floors. Keep items that you use a lot in easy-to-reach places. If you need to reach something above you, use a strong step stool that has a grab bar. Keep electrical cords out of the way. Do not use floor polish or wax that makes floors slippery. If you must use wax, use non-skid floor wax. Do not have throw rugs and other things on the floor that can make you trip. What can I do with my stairs? Do not leave any items on the stairs. Make sure that there are handrails on both sides of the stairs and use them. Fix handrails that are broken or loose. Make sure that handrails are as long as the stairways. Check any carpeting to make sure that it is firmly attached to the stairs. Fix any carpet that is loose or worn. Avoid having throw rugs at the  top or bottom of the stairs. If you do have throw rugs, attach them to the floor with carpet tape. Make sure that you have a light switch at the top of the stairs and the bottom of the stairs. If you do not have them, ask someone to add them for you. What else can I do to help prevent falls? Wear shoes that: Do not have high heels. Have rubber bottoms. Are comfortable and fit you well. Are closed at the toe. Do not wear sandals. If you use a stepladder: Make sure that it is fully opened. Do not climb a closed stepladder. Make sure that both sides of the stepladder are locked into place. Ask someone to hold it for you, if possible. Clearly mark and make sure that you can see: Any grab bars or handrails. First and last  steps. Where the edge of each step is. Use tools that help you move around (mobility aids) if they are needed. These include: Canes. Walkers. Scooters. Crutches. Turn on the lights when you go into a dark area. Replace any light bulbs as soon as they burn out. Set up your furniture so you have a clear path. Avoid moving your furniture around. If any of your floors are uneven, fix them. If there are any pets around you, be aware of where they are. Review your medicines with your doctor. Some medicines can make you feel dizzy. This can increase your chance of falling. Ask your doctor what other things that you can do to help prevent falls. This information is not intended to replace advice given to you by your health care provider. Make sure you discuss any questions you have with your health care provider. Document Released: 12/14/2008 Document Revised: 07/26/2015 Document Reviewed: 03/24/2014 Elsevier Interactive Patient Education  2017 [employer].

## 2023-01-27 NOTE — Progress Notes (Signed)
Subjective:   Anne Palmer is a 67 y.o. female who presents for Medicare Annual (Subsequent) preventive examination.  Visit Complete: Virtual I connected with  Anne Palmer on 01/27/23 by a video and audio enabled telemedicine application and verified that I am speaking with the correct person using two identifiers.  Patient Location: Home  Provider Location: Office/Clinic  I discussed the limitations of evaluation and management by telemedicine. The patient expressed understanding and agreed to proceed.  Vital Signs: Because this visit was a virtual/telehealth visit, some criteria may be missing or patient reported. Any vitals not documented were not able to be obtained and vitals that have been documented are patient reported.  Patient Medicare AWV questionnaire was completed by the patient on 01/27/2023; I have confirmed that all information answered by patient is correct and no changes since this date.  Cardiac Risk Factors include: advanced age (>6men, >51 women);family history of premature cardiovascular disease;hypertension;smoking/ tobacco exposure     Objective:    There were no vitals filed for this visit. There is no height or weight on file to calculate BMI.     01/27/2023   10:15 AM 07/10/2022   12:56 PM  Advanced Directives  Does Patient Have a Medical Advance Directive? No Yes  Does patient want to make changes to medical advance directive?  No - Patient declined  Would patient like information on creating a medical advance directive? No - Patient declined     Current Medications (verified) Outpatient Encounter Medications as of 01/27/2023  Medication Sig   acetaminophen (TYLENOL) 500 MG tablet Take 500 mg by mouth every 6 (six) hours as needed.   albuterol (VENTOLIN HFA) 108 (90 Base) MCG/ACT inhaler USE 2 INHALATIONS BY MOUTH  EVERY 6 HOURS AS NEEDED FOR WHEEZING OR SHORTNESS OF  BREATH   alendronate (FOSAMAX) 70 MG tablet TAKE 1 TABLET BY MOUTH ONCE  A WEEK TAKE  WITH  A  FULL  GLAS  OF  WATER  ON  AN  EMPTY  STOMACH   atorvastatin (LIPITOR) 20 MG tablet Take 20 mg by mouth daily.   calcium carbonate (CALCIUM 600) 600 MG TABS tablet Take 1 tablet (600 mg total) by mouth 2 (two) times daily with a meal.   lisinopril-hydrochlorothiazide (ZESTORETIC) 20-25 MG tablet TAKE 1 TABLET BY MOUTH DAILY   MYRBETRIQ 50 MG TB24 tablet Take 50 mg by mouth daily.   No facility-administered encounter medications on file as of 01/27/2023.    Allergies (verified) Sulfa antibiotics   History: Past Medical History:  Diagnosis Date   Arthritis    Diverticulitis 2015   Hematuria    Hypertension    Past Surgical History:  Procedure Laterality Date   ABDOMINAL HYSTERECTOMY     secondary to endometriosis and fibroids   COLONOSCOPY  2015   no polyps, diverticulosis   SHOULDER SURGERY  2008   left   Family History  Problem Relation Age of Onset   Arthritis Mother    Stroke Mother    Hypertension Mother    Diabetes Mother    Alcohol abuse Father    Arthritis Sister    Hyperlipidemia Sister    Diabetes Sister    Lupus Sister    Arthritis Brother    Diabetes Brother    Social History   Socioeconomic History   Marital status: Single    Spouse name: Not on file   Number of children: Not on file   Years of education: Not on file  Highest education level: Not on file  Occupational History   Not on file  Tobacco Use   Smoking status: Every Day    Current packs/day: 0.25    Types: Cigarettes   Smokeless tobacco: Never  Substance and Sexual Activity   Alcohol use: Yes    Comment: occasional   Drug use: No   Sexual activity: Not Currently    Birth control/protection: None  Other Topics Concern   Not on file  Social History Narrative   Not on file   Social Determinants of Health   Financial Resource Strain: Not on file  Food Insecurity: Not on file  Transportation Needs: Not on file  Physical Activity: Not on file  Stress: Not  on file  Social Connections: Not on file    Tobacco Counseling Ready to quit: Not Answered Counseling given: Not Answered   Clinical Intake:  Pre-visit preparation completed: No  Pain : No/denies pain     BMI - recorded: 27.41 Nutritional Status: BMI 25 -29 Overweight Diabetes: No  How often do you need to have someone help you when you read instructions, pamphlets, or other written materials from your doctor or pharmacy?: 1 - Never What is the last grade level you completed in school?: 11 grade  Interpreter Needed?: No  Information entered by :: Porsha McClurkin,CMA   Activities of Daily Living    01/27/2023   10:28 AM 01/27/2023   10:13 AM  In your present state of health, do you have any difficulty performing the following activities:  Hearing? 0 0  Vision? 0 0  Difficulty concentrating or making decisions? 1 0  Comment Remembering   Walking or climbing stairs? 1 0  Dressing or bathing? 0 0  Doing errands, shopping? 0 0  Preparing Food and eating ? N N  Using the Toilet? N N  In the past six months, have you accidently leaked urine? N N  Do you have problems with loss of bowel control? N N  Managing your Medications? N N  Managing your Finances? N N  Housekeeping or managing your Housekeeping? N N    Patient Care Team: Tanja Gift, [name], NP as PCP - General (Family Medicine)  Indicate any recent Medical Services you may have received from other than [facility] providers in the past year (date may be approximate).     Assessment:   This is a routine wellness examination for Janiya.  Hearing/Vision screen Hearing Screening - Comments:: No hearing concerns Vision Screening - Comments:: No vision concerns   Goals Addressed             This Visit's Progress    Patient Stated       Quit smoking        Depression Screen    01/27/2023   10:16 AM 01/12/2023    8:47 AM 07/10/2022   12:56 PM 03/23/2020    3:15 PM 12/21/2017   10:33 AM 12/09/2016    10:19 AM 03/07/2016    9:11 AM  PHQ 2/9 Scores  PHQ - 2 Score 0 0 0 0 0 0 0    Fall Risk    01/27/2023   10:16 AM 01/12/2023    8:47 AM 07/10/2022   12:56 PM 12/09/2016   10:19 AM 03/07/2016    9:11 AM  Fall Risk   Falls in the past year? 0 0 0 No No  Number falls in past yr: 0 0 0    Injury with Fall? 0 0 0  Risk for fall due to : History of fall(s) No Fall Risks No Fall Risks    Follow up Falls evaluation completed Falls evaluation completed Falls evaluation completed      MEDICARE RISK AT HOME: Medicare Risk at Home Any stairs in or around the home?: No If so, are there any without handrails?: No Home free of loose throw rugs in walkways, pet beds, electrical cords, etc?: No Adequate lighting in your home to reduce risk of falls?: Yes Life alert?: No Use of a cane, walker or w/c?: Yes (sometimes when outside) Grab bars in the bathroom?: No Shower chair or bench in shower?: No Elevated toilet seat or a handicapped toilet?: No  TIMED UP AND GO:  Was the test performed?  No    Cognitive Function:        01/27/2023   10:16 AM  6CIT Screen  What Year? 0 points  What month? 0 points  What time? 0 points  Count back from 20 0 points  Months in reverse 0 points  Repeat phrase 8 points  Total Score 8 points    Immunizations Immunization History  Administered Date(s) Administered   Fluad Trivalent(High Dose 65+) 01/12/2023   Pneumococcal-Unspecified 10/25/2021    TDAP status: Due, Education has been provided regarding the importance of this vaccine. Advised may receive this vaccine at local pharmacy or Health Dept. Aware to provide a copy of the vaccination record if obtained from local pharmacy or Health Dept. Verbalized acceptance and understanding.  Flu Vaccine status: Up to date  Pneumococcal vaccine status: Completed during today's visit.  Covid-19 vaccine status: Declined, Education has been provided regarding the importance of this vaccine but patient  still declined. Advised may receive this vaccine at local pharmacy or Health Dept.or vaccine clinic. Aware to provide a copy of the vaccination record if obtained from local pharmacy or Health Dept. Verbalized acceptance and understanding.  Qualifies for Shingles Vaccine? Yes   Zostavax completed No   Shingrix Completed?: No.    Education has been provided regarding the importance of this vaccine. Patient has been advised to call insurance company to determine out of pocket expense if they have not yet received this vaccine. Advised may also receive vaccine at local pharmacy or Health Dept. Verbalized acceptance and understanding.  Screening Tests Health Maintenance  Topic Date Due   DTaP/Tdap/Td (1 - Tdap) Never done   Zoster Vaccines- Shingrix (1 of 2) Never done   Colonoscopy  04/23/2023   Pneumonia Vaccine 64+ Years old (1 of 2 - PCV) 07/10/2023 (Originally 08/05/1961)   COVID-19 Vaccine (1 - 2023-24 season) 03/02/2024 (Originally 11/02/2022)   MAMMOGRAM  08/28/2023   Medicare Annual Wellness (AWV)  01/27/2024   INFLUENZA VACCINE  Completed   DEXA SCAN  Completed   Hepatitis C Screening  Completed   HPV VACCINES  Aged Out    Health Maintenance  Health Maintenance Due  Topic Date Due   DTaP/Tdap/Td (1 - Tdap) Never done   Zoster Vaccines- Shingrix (1 of 2) Never done   Colonoscopy  04/23/2023    Colorectal cancer screening: Referral to GI placed 01/12/2023 . Pt aware the office will call re: appt.  Mammogram status: Completed 08/28/2022. Repeat every year  Bone Density status: Completed 12/12/2021. Results reflect: Bone density results: OSTEOPOROSIS. Repeat every 2 years.  Lung Cancer Screening: (Low Dose CT Chest recommended if Age 82-80 years, 20 pack-year currently smoking OR have quit w/in 15years.) does qualify.   Lung Cancer Screening Referral: ordered  Additional Screening:  Hepatitis C Screening: does qualify; Completed yes   Vision Screening: Recommended annual  ophthalmology exams for early detection of glaucoma and other disorders of the eye. Is the patient up to date with their annual eye exam?  Yes  Who is the provider or what is the name of the office in which the patient attends annual eye exams? [facility]  If pt is not established with a provider, would they like to be referred to a provider to establish care? No .   Dental Screening: Recommended annual dental exams for proper oral hygiene  Diabetic Foot Exam: Diabetic Foot Exam: Completed N/A   Community Resource Referral / Chronic Care Management: CRR required this visit?  No   CCM required this visit?  No     Plan:     I have personally reviewed and noted the following in the patient's chart:   Medical and social history Use of alcohol, tobacco or illicit drugs  Current medications and supplements including opioid prescriptions. Patient is not currently taking opioid prescriptions. Functional ability and status Nutritional status Physical activity Advanced directives List of other physicians Hospitalizations, surgeries, and ER visits in previous 12 months Vitals Screenings to include cognitive, depression, and falls Referrals and appointments  In addition, I have reviewed and discussed with patient certain preventive protocols, quality metrics, and best practice recommendations. A written personalized care plan for preventive services as well as general preventive health recommendations were provided to patient.     Caesar Bookman, NP   01/27/2023   After Visit Summary: (MyChart) Due to this being a telephonic visit, the after visit summary with patients personalized plan was offered to patient via MyChart   Nurse Notes: Aware to get her DTap and shingles vaccine at the pharmacy.

## 2023-01-27 NOTE — Progress Notes (Signed)
  This service is provided via telemedicine  No vital signs collected/recorded due to the encounter was a telemedicine visit.   Location of patient (ex: home, work):  Home  Patient consents to a telephone visit:    Location of the provider (ex: office, home):  Office  Name of any referring provider:  Ngetich, [name], NP   Names of all persons participating in the telemedicine service and their role in the encounter:  Anne Palmer (patient); Khristina Janota,CMA; Dinah Ngetich,NP  Time spent on call:  8 minutes

## 2023-02-16 ENCOUNTER — Ambulatory Visit
Admission: RE | Admit: 2023-02-16 | Discharge: 2023-02-16 | Disposition: A | Discharge status: Home / Self Care | Payer: 59 | Source: Ambulatory Visit | Attending: Family | Admitting: Family

## 2023-02-16 DIAGNOSIS — F17200 Nicotine dependence, unspecified, uncomplicated: Secondary | ICD-10-CM

## 2023-02-16 DIAGNOSIS — F17210 Nicotine dependence, cigarettes, uncomplicated: Secondary | ICD-10-CM | POA: Diagnosis not present

## 2023-03-02 ENCOUNTER — Other Ambulatory Visit: Payer: Self-pay

## 2023-03-02 DIAGNOSIS — T50905D Adverse effect of unspecified drugs, medicaments and biological substances, subsequent encounter: Secondary | ICD-10-CM

## 2023-03-02 DIAGNOSIS — E782 Mixed hyperlipidemia: Secondary | ICD-10-CM

## 2023-03-02 MED ORDER — ATORVASTATIN CALCIUM 40 MG PO TABS
40.0000 mg | ORAL_TABLET | Freq: Every day | ORAL | 3 refills | Status: DC
Start: 2023-03-02 — End: 2023-11-19

## 2023-03-02 MED ORDER — ASPIRIN 81 MG PO TBEC: 81.0000 mg | DELAYED_RELEASE_TABLET | Freq: Every day | ORAL | Status: AC

## 2023-03-02 NOTE — Addendum Note (Signed)
Addended by: Maurice Small on: 03/02/2023 03:05 PM   Modules accepted: Orders

## 2023-03-02 NOTE — Progress Notes (Signed)
Noted  

## 2023-03-03 ENCOUNTER — Other Ambulatory Visit: Payer: Self-pay | Admitting: Family

## 2023-03-03 DIAGNOSIS — I2510 Atherosclerotic heart disease of native coronary artery without angina pectoris: Secondary | ICD-10-CM

## 2023-03-03 DIAGNOSIS — I700 Atherosclerosis of aorta: Secondary | ICD-10-CM

## 2023-03-13 ENCOUNTER — Encounter: Payer: Self-pay | Admitting: Gastroenterology

## 2023-03-18 ENCOUNTER — Ambulatory Visit: Payer: 59 | Admitting: Family Medicine

## 2023-03-23 ENCOUNTER — Other Ambulatory Visit: Payer: Self-pay

## 2023-03-23 ENCOUNTER — Other Ambulatory Visit: Payer: 59

## 2023-03-23 DIAGNOSIS — R7303 Prediabetes: Secondary | ICD-10-CM

## 2023-03-23 DIAGNOSIS — T50905D Adverse effect of unspecified drugs, medicaments and biological substances, subsequent encounter: Secondary | ICD-10-CM

## 2023-03-23 DIAGNOSIS — E782 Mixed hyperlipidemia: Secondary | ICD-10-CM

## 2023-04-03 ENCOUNTER — Other Ambulatory Visit: Payer: 59

## 2023-04-06 ENCOUNTER — Other Ambulatory Visit: Payer: Self-pay | Admitting: *Deleted

## 2023-04-06 MED ORDER — ALENDRONATE SODIUM 70 MG PO TABS: 70.0000 mg | ORAL_TABLET | ORAL | 1 refills | Status: DC

## 2023-04-06 NOTE — Telephone Encounter (Signed)
[facility] Requested refill.

## 2023-04-15 ENCOUNTER — Other Ambulatory Visit: Payer: 59

## 2023-04-15 DIAGNOSIS — E782 Mixed hyperlipidemia: Secondary | ICD-10-CM | POA: Diagnosis not present

## 2023-04-15 DIAGNOSIS — T50905D Adverse effect of unspecified drugs, medicaments and biological substances, subsequent encounter: Secondary | ICD-10-CM | POA: Diagnosis not present

## 2023-04-15 DIAGNOSIS — R7303 Prediabetes: Secondary | ICD-10-CM | POA: Diagnosis not present

## 2023-04-16 ENCOUNTER — Ambulatory Visit (AMBULATORY_SURGERY_CENTER): Payer: 59

## 2023-04-16 VITALS — Ht 67.0 in | Wt 178.0 lb

## 2023-04-16 DIAGNOSIS — Z1211 Encounter for screening for malignant neoplasm of colon: Secondary | ICD-10-CM

## 2023-04-16 LAB — HEPATIC FUNCTION PANEL
AG Ratio: 1.8 (calc) (ref 1.0–2.5)
ALT: 15 U/L (ref 6–29)
AST: 14 U/L (ref 10–35)
Albumin: 4.4 g/dL (ref 3.6–5.1)
Alkaline phosphatase (APISO): 84 U/L (ref 37–153)
Bilirubin, Direct: 0.2 mg/dL (ref 0.0–0.2)
Globulin: 2.5 g/dL (ref 1.9–3.7)
Indirect Bilirubin: 0.7 mg/dL (ref 0.2–1.2)
Total Bilirubin: 0.9 mg/dL (ref 0.2–1.2)
Total Protein: 6.9 g/dL (ref 6.1–8.1)

## 2023-04-16 LAB — HEMOGLOBIN A1C
Hgb A1c MFr Bld: 5.9 %{Hb} — ABNORMAL HIGH (ref <5.7)
Mean Plasma Glucose: 123 mg/dL
eAG (mmol/L): 6.8 mmol/L

## 2023-04-16 MED ORDER — SUFLAVE 178.7 G PO SOLR
1.0000 | Freq: Once | ORAL | 0 refills | Status: AC
Start: 2023-04-16 — End: 2023-04-16

## 2023-04-16 NOTE — Progress Notes (Signed)
No egg or soy allergy known to patient  No issues known to pt with past sedation with any surgeries or procedures Patient denies ever being told they had issues or difficulty with intubation  No FH of Malignant Hyperthermia Pt is not on diet pills Pt is not on  home 02  Pt is not on blood thinners  Pt denies issues with constipation  No A fib or A flutter Have any cardiac testing pending- yes 05/19/23 Pt can ambulate independently Pt denies use of chewing tobacco Discussed diabetic I weight loss medication holds Discussed NSAID holds Checked BMI Pt instructed to use Singlecare.com or GoodRx for a price reduction on prep  Patient's chart reviewed by Cathlyn Parsons CNRA prior to previsit and patient appropriate for the [facility].  Pre visit completed and red dot placed by patient's name on their procedure day (on provider's schedule).

## 2023-05-01 ENCOUNTER — Other Ambulatory Visit: Payer: Self-pay | Admitting: Family

## 2023-05-01 ENCOUNTER — Encounter: Payer: Self-pay | Admitting: Gastroenterology

## 2023-05-01 DIAGNOSIS — E782 Mixed hyperlipidemia: Secondary | ICD-10-CM

## 2023-05-01 DIAGNOSIS — I10 Essential (primary) hypertension: Secondary | ICD-10-CM

## 2023-05-04 ENCOUNTER — Other Ambulatory Visit: Payer: 59

## 2023-05-04 DIAGNOSIS — I10 Essential (primary) hypertension: Secondary | ICD-10-CM

## 2023-05-04 DIAGNOSIS — E782 Mixed hyperlipidemia: Secondary | ICD-10-CM | POA: Diagnosis not present

## 2023-05-05 ENCOUNTER — Ambulatory Visit: Payer: 59 | Admitting: Gastroenterology

## 2023-05-05 ENCOUNTER — Encounter: Payer: Self-pay | Admitting: Gastroenterology

## 2023-05-05 VITALS — BP 121/62 | HR 62 | Temp 98.1°F | Resp 20 | Ht 67.0 in | Wt 178.0 lb

## 2023-05-05 DIAGNOSIS — K640 First degree hemorrhoids: Secondary | ICD-10-CM

## 2023-05-05 DIAGNOSIS — Z1211 Encounter for screening for malignant neoplasm of colon: Secondary | ICD-10-CM

## 2023-05-05 DIAGNOSIS — K648 Other hemorrhoids: Secondary | ICD-10-CM | POA: Diagnosis not present

## 2023-05-05 DIAGNOSIS — D125 Benign neoplasm of sigmoid colon: Secondary | ICD-10-CM | POA: Diagnosis not present

## 2023-05-05 DIAGNOSIS — K635 Polyp of colon: Secondary | ICD-10-CM

## 2023-05-05 DIAGNOSIS — I10 Essential (primary) hypertension: Secondary | ICD-10-CM | POA: Diagnosis not present

## 2023-05-05 LAB — COMPLETE METABOLIC PANEL WITH GFR
AG Ratio: 1.8 (calc) (ref 1.0–2.5)
ALT: 14 U/L (ref 6–29)
AST: 11 U/L (ref 10–35)
Albumin: 4.6 g/dL (ref 3.6–5.1)
Alkaline phosphatase (APISO): 84 U/L (ref 37–153)
BUN: 15 mg/dL (ref 7–25)
CO2: 29 mmol/L (ref 20–32)
Calcium: 9.8 mg/dL (ref 8.6–10.4)
Chloride: 107 mmol/L (ref 98–110)
Creat: 0.65 mg/dL (ref 0.50–1.05)
Globulin: 2.6 g/dL (ref 1.9–3.7)
Glucose, Bld: 99 mg/dL (ref 65–139)
Potassium: 4.3 mmol/L (ref 3.5–5.3)
Sodium: 143 mmol/L (ref 135–146)
Total Bilirubin: 0.7 mg/dL (ref 0.2–1.2)
Total Protein: 7.2 g/dL (ref 6.1–8.1)
eGFR: 97 mL/min/{1.73_m2} (ref >=60)

## 2023-05-05 LAB — CBC WITH DIFFERENTIAL/PLATELET
Absolute Lymphocytes: 2641 {cells}/uL (ref 850–3900)
Absolute Monocytes: 340 {cells}/uL (ref 200–950)
Basophils Absolute: 32 {cells}/uL (ref 0–200)
Basophils Relative: 0.6 %
Eosinophils Absolute: 108 {cells}/uL (ref 15–500)
Eosinophils Relative: 2 %
HCT: 45.2 % — ABNORMAL HIGH (ref 35.0–45.0)
Hemoglobin: 14.3 g/dL (ref 11.7–15.5)
MCH: 30 pg (ref 27.0–33.0)
MCHC: 31.6 g/dL — ABNORMAL LOW (ref 32.0–36.0)
MCV: 95 fL (ref 80.0–100.0)
MPV: 12.8 fL — ABNORMAL HIGH (ref 7.5–12.5)
Monocytes Relative: 6.3 %
Neutro Abs: 2279 {cells}/uL (ref 1500–7800)
Neutrophils Relative %: 42.2 %
Platelets: 219 10*3/uL (ref 140–400)
RBC: 4.76 10*6/uL (ref 3.80–5.10)
RDW: 12.7 % (ref 11.0–15.0)
Total Lymphocyte: 48.9 %
WBC: 5.4 10*3/uL (ref 3.8–10.8)

## 2023-05-05 LAB — LIPID PANEL
Cholesterol: 113 mg/dL (ref <200)
HDL: 45 mg/dL — ABNORMAL LOW (ref >=50)
LDL Cholesterol (Calc): 48 mg/dL
Non-HDL Cholesterol (Calc): 68 mg/dL (ref <130)
Total CHOL/HDL Ratio: 2.5 (calc) (ref <5.0)
Triglycerides: 112 mg/dL (ref <150)

## 2023-05-05 LAB — TSH: TSH: 1.21 m[IU]/L (ref 0.40–4.50)

## 2023-05-05 MED ORDER — SODIUM CHLORIDE 0.9 % IV SOLN: 500.0000 mL | Freq: Once | INTRAVENOUS | Status: DC

## 2023-05-05 NOTE — Progress Notes (Unsigned)
 GASTROENTEROLOGY PROCEDURE H&P NOTE   Primary Care Physician: Ngetich, [name], NP    Reason for Procedure:  Colon Cancer screening  Plan:    Colonoscopy  Patient is appropriate for endoscopic procedure(s) in the ambulatory ([facility]) setting.  The nature of the procedure, as well as the risks, benefits, and alternatives were carefully and thoroughly reviewed with the patient. Ample time for discussion and questions allowed. The patient understood, was satisfied, and agreed to proceed.     HPI: Anne Palmer is a 68 y.o. female who presents for colonoscopy for routine Colon Cancer screening.  No active GI symptoms.  No known family history of colon cancer or related malignancy.  Patient is otherwise without complaints or active issues today.  Last colonoscopy was in 04/2013 in [place] and notable for diverticulosis and internal hemorrhoids, but no polyps.   Past Medical History:  Diagnosis Date   Allergy    Arthritis    Cataract    COPD (chronic obstructive pulmonary disease) (HCC)    Diverticulitis 2015   Hematuria    Hyperlipidemia    Hypertension     Past Surgical History:  Procedure Laterality Date   ABDOMINAL HYSTERECTOMY     secondary to endometriosis and fibroids   COLONOSCOPY  2015   no polyps, diverticulosis   SHOULDER SURGERY  2008   left    Prior to Admission medications   Medication Sig Start Date End Date Taking? Authorizing Provider  acetaminophen (TYLENOL) 500 MG tablet Take 500 mg by mouth every 6 (six) hours as needed.   Yes [provider]  lisinopril-hydrochlorothiazide (ZESTORETIC) 20-25 MG tablet TAKE 1 TABLET BY MOUTH DAILY 06/19/21  Yes Olive Bass, FNP  albuterol (VENTOLIN HFA) 108 (90 Base) MCG/ACT inhaler USE 2 INHALATIONS BY MOUTH  EVERY 6 HOURS AS NEEDED FOR WHEEZING OR SHORTNESS OF  BREATH 08/21/22   Ngetich, Dinah C, NP  alendronate (FOSAMAX) 70 MG tablet Take 1 tablet (70 mg total) by mouth once a week. Take with a full  glass of water on an empty stomach. 04/06/23   Ngetich, Dinah C, NP  aspirin EC 81 MG tablet Take 1 tablet (81 mg total) by mouth daily. Swallow whole. 03/02/23   Ngetich, Dinah C, NP  atorvastatin (LIPITOR) 40 MG tablet Take 1 tablet (40 mg total) by mouth daily. E78.2 03/02/23   Ngetich, Dinah C, NP  buPROPion (WELLBUTRIN SR) 150 MG 12 hr tablet Take 1 tablet (150 mg total) by mouth daily. Patient not taking: Reported on 04/16/2023 01/27/23   Ngetich, Dinah C, NP  calcium carbonate (CALCIUM 600) 600 MG TABS tablet Take 1 tablet (600 mg total) by mouth 2 (two) times daily with a meal. 12/09/16   Nche, Bonna Gains, NP  MYRBETRIQ 50 MG TB24 tablet Take 50 mg by mouth daily. 07/01/22   [provider]    Current Outpatient Medications  Medication Sig Dispense Refill   acetaminophen (TYLENOL) 500 MG tablet Take 500 mg by mouth every 6 (six) hours as needed.     lisinopril-hydrochlorothiazide (ZESTORETIC) 20-25 MG tablet TAKE 1 TABLET BY MOUTH DAILY 90 tablet 3   albuterol (VENTOLIN HFA) 108 (90 Base) MCG/ACT inhaler USE 2 INHALATIONS BY MOUTH  EVERY 6 HOURS AS NEEDED FOR WHEEZING OR SHORTNESS OF  BREATH 34 g 3   alendronate (FOSAMAX) 70 MG tablet Take 1 tablet (70 mg total) by mouth once a week. Take with a full glass of water on an empty stomach. 12 tablet 1  aspirin EC 81 MG tablet Take 1 tablet (81 mg total) by mouth daily. Swallow whole.     atorvastatin (LIPITOR) 40 MG tablet Take 1 tablet (40 mg total) by mouth daily. E78.2 90 tablet 3   buPROPion (WELLBUTRIN SR) 150 MG 12 hr tablet Take 1 tablet (150 mg total) by mouth daily. (Patient not taking: Reported on 04/16/2023) 30 tablet 0   calcium carbonate (CALCIUM 600) 600 MG TABS tablet Take 1 tablet (600 mg total) by mouth 2 (two) times daily with a meal. 60 tablet    MYRBETRIQ 50 MG TB24 tablet Take 50 mg by mouth daily.     Current Facility-Administered Medications  Medication Dose Route Frequency Provider Last Rate Last Admin   0.9  %  sodium chloride infusion  500 mL Intravenous Once Terran Hollenkamp V, DO        Allergies as of 05/05/2023 - Review Complete 05/05/2023  Allergen Reaction Noted   Sulfa antibiotics Hives 03/09/2011    Family History  Problem Relation Age of Onset   Arthritis Mother    Stroke Mother    Hypertension Mother    Diabetes Mother    Alcohol abuse Father    Arthritis Sister    Hyperlipidemia Sister    Diabetes Sister    Lupus Sister    Arthritis Brother    Diabetes Brother    Colon cancer Neg Hx    Colon polyps Neg Hx    Esophageal cancer Neg Hx    Rectal cancer Neg Hx    Stomach cancer Neg Hx     Social History   Socioeconomic History   Marital status: Single    Spouse name: Not on file   Number of children: Not on file   Years of education: Not on file   Highest education level: Not on file  Occupational History   Not on file  Tobacco Use   Smoking status: Every Day    Current packs/day: 0.25    Types: Cigarettes   Smokeless tobacco: Never  Substance and Sexual Activity   Alcohol use: Yes    Comment: occasional   Drug use: No   Sexual activity: Not Currently    Birth control/protection: None  Other Topics Concern   Not on file  Social History Narrative   Not on file   Social Drivers of Health   Financial Resource Strain: Not on file  Food Insecurity: Not on file  Transportation Needs: Not on file  Physical Activity: Not on file  Stress: Not on file  Social Connections: Not on file  Intimate Partner Violence: Not on file    Physical Exam: Vital signs in last 24 hours: @BP  117/65   Pulse 66   Temp 98.1 F (36.7 C)   Ht 5\' 7"  (1.702 m)   Wt 178 lb (80.7 kg)   SpO2 98%   BMI 27.88 kg/m  GEN: NAD EYE: Sclerae anicteric ENT: MMM CV: Non-tachycardic Pulm: CTA b/l GI: Soft, NT/ND NEURO:  Alert & Oriented x 3   Doristine Locks, DO [facility]   05/05/2023 10:58 AM

## 2023-05-05 NOTE — Op Note (Signed)
 [facility] Patient Name: Anne Palmer Procedure Date: 05/05/2023 11:21 AM MRN: [id number] Endoscopist: Doristine Locks , MD, [id number] Age: 68 Referring MD:  Date of Birth: 01/28/1956 Gender: Female Account #: [id number] Procedure:                Colonoscopy Indications:              Screening for colorectal malignant neoplasm. Last                            colonoscopy was 10 years ago in [place] and no polyps.                            No recent GI sxs. Medicines:                Monitored Anesthesia Care Procedure:                Pre-Anesthesia Assessment:                           - Prior to the procedure, a History and Physical                            was performed, and patient medications and                            allergies were reviewed. The patient's tolerance of                            previous anesthesia was also reviewed. The risks                            and benefits of the procedure and the sedation                            options and risks were discussed with the patient.                            All questions were answered, and informed consent                            was obtained. Prior Anticoagulants: The patient has                            taken no anticoagulant or antiplatelet agents. ASA                            Grade Assessment: II - A patient with mild systemic                            disease. After reviewing the risks and benefits,                            the patient was deemed in satisfactory condition to  undergo the procedure.                           After obtaining informed consent, the colonoscope                            was passed under direct vision. Throughout the                            procedure, the patient's blood pressure, pulse, and                            oxygen saturations were monitored continuously. The                            Olympus Scope SN [id number] was  introduced through the                            anus and advanced to the the terminal ileum. The                            colonoscopy was performed without difficulty. The                            patient tolerated the procedure well. The quality                            of the bowel preparation was good. The terminal                            ileum, ileocecal valve, appendiceal orifice, and                            rectum were photographed. Scope In: 11:33:41 AM Scope Out: 11:48:49 AM Scope Withdrawal Time: 0 hours 11 minutes 59 seconds  Total Procedure Duration: 0 hours 15 minutes 8 seconds  Findings:                 The perianal and digital rectal examinations were                            normal.                           A 3 mm polyp was found in the recto-sigmoid colon.                            The polyp was sessile. The polyp was removed with a                            cold snare. Resection and retrieval were complete.                            Estimated blood loss was minimal.  The exam was otherwise normal throughout the                            remainder of the colon.                           Non-bleeding internal hemorrhoids were found during                            retroflexion. The hemorrhoids were small.                           The terminal ileum appeared normal. Complications:            No immediate complications. Estimated Blood Loss:     Estimated blood loss was minimal. Impression:               - One 3 mm polyp at the recto-sigmoid colon,                            removed with a cold snare. Resected and retrieved.                           - Non-bleeding internal hemorrhoids.                           - The examined portion of the ileum was normal. Recommendation:           - Patient has a contact number available for                            emergencies. The signs and symptoms of potential                             delayed complications were discussed with the                            patient. Return to normal activities tomorrow.                            Written discharge instructions were provided to the                            patient.                           - Resume previous diet.                           - Continue present medications.                           - Await pathology results.                           - Repeat colonoscopy in 5-10 years for surveillance  based on pathology results.                           - Return to GI office PRN. Doristine Locks, MD 05/05/2023 11:53:46 AM

## 2023-05-05 NOTE — Progress Notes (Unsigned)
 Called to room to assist during endoscopic procedure.  Patient ID and intended procedure confirmed with present staff. Received instructions for my participation in the procedure from the performing physician.

## 2023-05-05 NOTE — Patient Instructions (Addendum)
-   Resume previous diet. - Continue present medications. - Await pathology results. - Repeat colonoscopy in 5-10 years for surveillance based on pathology results. - Return to GI office PRN.  YOU HAD AN ENDOSCOPIC PROCEDURE TODAY AT [facility]:   Refer to the procedure report that was given to you for any specific questions about what was found during the examination.  If the procedure report does not answer your questions, please call your gastroenterologist to clarify.  If you requested that your care partner not be given the details of your procedure findings, then the procedure report has been included in a sealed envelope for you to review at your convenience later.  YOU SHOULD EXPECT: Some feelings of bloating in the abdomen. Passage of more gas than usual.  Walking can help get rid of the air that was put into your GI tract during the procedure and reduce the bloating. If you had a lower endoscopy (such as a colonoscopy or flexible sigmoidoscopy) you may notice spotting of blood in your stool or on the toilet paper. If you underwent a bowel prep for your procedure, you may not have a normal bowel movement for a few days.  Please Note:  You might notice some irritation and congestion in your nose or some drainage.  This is from the oxygen used during your procedure.  There is no need for concern and it should clear up in a day or so.  SYMPTOMS TO REPORT IMMEDIATELY:  Following lower endoscopy (colonoscopy or flexible sigmoidoscopy):  Excessive amounts of blood in the stool  Significant tenderness or worsening of abdominal pains  Swelling of the abdomen that is new, acute  Fever of 100F or higher  For urgent or emergent issues, a gastroenterologist can be reached at any hour by calling [phone]. Do not use MyChart messaging for urgent concerns.    DIET:  We do recommend a small meal at first, but then you may proceed to your regular diet.  Drink plenty of fluids  but you should avoid alcoholic beverages for 24 hours.  ACTIVITY:  You should plan to take it easy for the rest of today and you should NOT DRIVE or use heavy machinery until tomorrow (because of the sedation medicines used during the test).    FOLLOW UP: Our staff will call the number listed on your records the next business day following your procedure.  We will call around 7:15- 8:00 am to check on you and address any questions or concerns that you may have regarding the information given to you following your procedure. If we do not reach you, we will leave a message.     If any biopsies were taken you will be contacted by phone or by letter within the next 1-3 weeks.  Please call us at [phone] if you have not heard about the biopsies in 3 weeks.    SIGNATURES/CONFIDENTIALITY: You and/or your care partner have signed paperwork which will be entered into your electronic medical record.  These signatures attest to the fact that that the information above on your After Visit Summary has been reviewed and is understood.  Full responsibility of the confidentiality of this discharge information lies with you and/or your care-partner.

## 2023-05-05 NOTE — Progress Notes (Unsigned)
 Sedate, gd SR, tolerated procedure well, VSS, report to RN

## 2023-05-05 NOTE — Progress Notes (Signed)
 Pt's states no medical or surgical changes since previsit or office visit.

## 2023-05-06 ENCOUNTER — Telehealth: Payer: Self-pay

## 2023-05-06 NOTE — Telephone Encounter (Signed)
 Left message on answering machine.

## 2023-05-07 LAB — SURGICAL PATHOLOGY

## 2023-05-08 ENCOUNTER — Encounter: Payer: Self-pay | Admitting: Gastroenterology

## 2023-05-19 ENCOUNTER — Encounter: Payer: Self-pay | Admitting: Cardiology

## 2023-05-19 ENCOUNTER — Ambulatory Visit: Payer: 59 | Attending: Cardiology | Admitting: Cardiology

## 2023-05-19 VITALS — BP 130/76 | HR 67 | Resp 16 | Ht 67.0 in | Wt 173.2 lb

## 2023-05-19 DIAGNOSIS — I10 Essential (primary) hypertension: Secondary | ICD-10-CM | POA: Diagnosis not present

## 2023-05-19 DIAGNOSIS — F17200 Nicotine dependence, unspecified, uncomplicated: Secondary | ICD-10-CM

## 2023-05-19 DIAGNOSIS — E7800 Pure hypercholesterolemia, unspecified: Secondary | ICD-10-CM | POA: Diagnosis not present

## 2023-05-19 DIAGNOSIS — I2510 Atherosclerotic heart disease of native coronary artery without angina pectoris: Secondary | ICD-10-CM

## 2023-05-19 MED ORDER — NICOTINE 21 MG/24HR TD PT24: 21.0000 mg | MEDICATED_PATCH | Freq: Every day | TRANSDERMAL | 0 refills | Status: AC

## 2023-05-19 MED ORDER — NICOTINE 7 MG/24HR TD PT24: 7.0000 mg | MEDICATED_PATCH | Freq: Every day | TRANSDERMAL | 0 refills | Status: DC

## 2023-05-19 MED ORDER — NICOTINE 14 MG/24HR TD PT24: 14.0000 mg | MEDICATED_PATCH | Freq: Every day | TRANSDERMAL | 0 refills | Status: DC

## 2023-05-19 NOTE — Progress Notes (Unsigned)
 Cardiology Office Note:  .   Date:  05/20/2023  ID:  Anne Palmer, DOB [birth year], MRN [id number] PCP: Caesar Bookman, NP  [facility] Providers Cardiologist:  Yates Decamp, MD   History of Present Illness: .   Anne Palmer is a 68 y.o. with hypertension, tobacco use disorder with COPD-Emphysema, hypercholesterolemia presents to establish care due to coronary calcification noted on CT.   Discussed the use of AI scribe software for clinical note transcription with the patient, who gave verbal consent to proceed.  History of Present Illness   The patient, with a history of hypertension, hyperlipidemia, and COPD, presents for a routine follow-up. She reports feeling in good health and is able to walk long distances without chest pain or shortness of breath. However, she continues to smoke, which she finds calming during times of stress. She expresses a desire to quit smoking and has previously tried antidepressant medication for smoking cessation without success. She has not tried nicotine patches. She takes a daily baby aspirin and has well-controlled blood pressure and cholesterol levels. She is aware of her COPD diagnosis and has recently had a CT scan showing advanced emphysema and significant blockages in her heart. She expresses a desire to live and does not have a living will.      Labs   Lab Results  Component Value Date   CHOL 113 05/04/2023   HDL 45 (L) 05/04/2023   LDLCALC 48 05/04/2023   TRIG 112 05/04/2023   CHOLHDL 2.5 05/04/2023   Lab Results  Component Value Date   NA 143 05/04/2023   K 4.3 05/04/2023   CO2 29 05/04/2023   GLUCOSE 99 05/04/2023   BUN 15 05/04/2023   CREATININE 0.65 05/04/2023   CALCIUM 9.8 05/04/2023   GFR 87.39 03/23/2020   EGFR 97 05/04/2023      Latest Ref Rng & Units 05/04/2023    1:45 PM 01/12/2023    9:25 AM 07/10/2022    2:25 PM  BMP  Glucose 65 - 139 mg/dL 99  99  94   BUN 7 - 25 mg/dL 15  12  10    Creatinine 0.50 - 1.05  mg/dL 3.08  6.57  8.46   BUN/Creat Ratio 6 - 22 (calc) SEE NOTE:  SEE NOTE:  SEE NOTE:   Sodium 135 - 146 mmol/L 143  143  144   Potassium 3.5 - 5.3 mmol/L 4.3  4.6  4.0   Chloride 98 - 110 mmol/L 107  105  105   CO2 20 - 32 mmol/L 29  30  27    Calcium 8.6 - 10.4 mg/dL 9.8  9.4  9.9       Latest Ref Rng & Units 05/04/2023    1:45 PM 01/12/2023    9:25 AM 07/10/2022    2:25 PM  CBC  WBC 3.8 - 10.8 Thousand/uL 5.4  4.9  5.2   Hemoglobin 11.7 - 15.5 g/dL 96.2  95.2  84.1   Hematocrit 35.0 - 45.0 % 45.2  44.6  44.2   Platelets 140 - 400 Thousand/uL 219  196  227    Lab Results  Component Value Date   HGBA1C 5.9 (H) 04/15/2023    Lab Results  Component Value Date   TSH 1.21 05/04/2023   Review of Systems  Cardiovascular:  Negative for chest pain, dyspnea on exertion and leg swelling.   Physical Exam:   VS:  BP 130/76 (BP Location: Left Arm, Patient Position: Sitting, Cuff Size:  Large)   Pulse 67   Resp 16   Ht 5\' 7"  (1.702 m)   Wt 173 lb 3.2 oz (78.6 kg)   SpO2 98%   BMI 27.13 kg/m    Wt Readings from Last 3 Encounters:  05/19/23 173 lb 3.2 oz (78.6 kg)  05/05/23 178 lb (80.7 kg)  04/16/23 178 lb (80.7 kg)   Physical Exam Neck:     Vascular: No carotid bruit or JVD.  Cardiovascular:     Rate and Rhythm: Normal rate and regular rhythm.     Pulses: Intact distal pulses.     Heart sounds: Normal heart sounds. No murmur heard.    No gallop.  Pulmonary:     Effort: Pulmonary effort is normal.     Breath sounds: Normal breath sounds.  Abdominal:     General: Bowel sounds are normal.     Palpations: Abdomen is soft.  Musculoskeletal:     Right lower leg: No edema.     Left lower leg: No edema.    Studies Reviewed: Marland Kitchen    Low-dose CT scan of the chest for lung cancer screening 02/28/2023: Cardiovascular:  Heart size is normal. There is no significant pericardial fluid, thickening or pericardial calcification. There is aortic atherosclerosis, as well as atherosclerosis  of the great vessels of the mediastinum and the coronary arteries, including calcified atherosclerotic plaque in the left main, left anterior descending, left circumflex and right coronary arteries. Lungs/Pleura:  No definite suspicious appearing pulmonary nodules or masses are noted. No acute consolidative airspace disease. No pleural effusions. Mild diffuse bronchial wall thickening with mild centrilobular and paraseptal emphysema.   EKG:    EKG Interpretation Date/Time:  Tuesday May 19 2023 10:20:16 EDT Ventricular Rate:  66 PR Interval:  166 QRS Duration:  82 QT Interval:  424 QTC Calculation: 444 R Axis:   66  Text Interpretation: EKG 05/19/2023: Normal sinus rhythm with rate of 66 bpm, normal axis.  No evidence of ischemia, normal EKG.  No significant change from 02/11/2007. Confirmed by Delrae Rend [phone]) on 05/19/2023 11:15:59 AM    Medications and allergies    Allergies  Allergen Reactions   Sulfa Antibiotics Hives    Current Outpatient Medications:    acetaminophen (TYLENOL) 500 MG tablet, Take 500 mg by mouth every 6 (six) hours as needed., Disp: , Rfl:    albuterol (VENTOLIN HFA) 108 (90 Base) MCG/ACT inhaler, USE 2 INHALATIONS BY MOUTH  EVERY 6 HOURS AS NEEDED FOR WHEEZING OR SHORTNESS OF  BREATH, Disp: 34 g, Rfl: 3   alendronate (FOSAMAX) 70 MG tablet, Take 1 tablet (70 mg total) by mouth once a week. Take with a full glass of water on an empty stomach., Disp: 12 tablet, Rfl: 1   aspirin EC 81 MG tablet, Take 1 tablet (81 mg total) by mouth daily. Swallow whole., Disp: , Rfl:    atorvastatin (LIPITOR) 40 MG tablet, Take 1 tablet (40 mg total) by mouth daily. E78.2, Disp: 90 tablet, Rfl: 3   calcium carbonate (CALCIUM 600) 600 MG TABS tablet, Take 1 tablet (600 mg total) by mouth 2 (two) times daily with a meal., Disp: 60 tablet, Rfl:    lisinopril-hydrochlorothiazide (ZESTORETIC) 20-25 MG tablet, TAKE 1 TABLET BY MOUTH DAILY, Disp: 90 tablet, Rfl: 3   MYRBETRIQ 50  MG TB24 tablet, Take 50 mg by mouth daily., Disp: , Rfl:    nicotine (NICODERM CQ - DOSED IN MG/24 HOURS) 21 mg/24hr patch, Place 1 patch (21 mg total)  onto the skin daily for 28 days., Disp: 28 patch, Rfl: 0   [START ON 06/17/2023] nicotine (NICODERM CQ) 14 mg/24hr patch, Place 1 patch (14 mg total) onto the skin daily., Disp: 28 patch, Rfl: 0   [START ON 07/16/2023] nicotine (NICODERM CQ) 7 mg/24hr patch, Place 1 patch (7 mg total) onto the skin daily., Disp: 28 patch, Rfl: 0   ASSESSMENT AND PLAN: .      ICD-10-CM   1. Coronary artery calcification seen on CAT scan  I25.10     2. Primary hypertension  I10 EKG 12-Lead    3. Tobacco use disorder  F17.200 nicotine (NICODERM CQ - DOSED IN MG/24 HOURS) 21 mg/24hr patch    nicotine (NICODERM CQ) 14 mg/24hr patch    nicotine (NICODERM CQ) 7 mg/24hr patch    4. Pure hypercholesterolemia  E78.00      Orders Placed This Encounter  Procedures   EKG 12-Lead    Meds ordered this encounter  Medications   nicotine (NICODERM CQ - DOSED IN MG/24 HOURS) 21 mg/24hr patch    Sig: Place 1 patch (21 mg total) onto the skin daily for 28 days.    Dispense:  28 patch    Refill:  0   nicotine (NICODERM CQ) 14 mg/24hr patch    Sig: Place 1 patch (14 mg total) onto the skin daily.    Dispense:  28 patch    Refill:  0    Fill after completed 21 mg dose   nicotine (NICODERM CQ) 7 mg/24hr patch    Sig: Place 1 patch (7 mg total) onto the skin daily.    Dispense:  28 patch    Refill:  0    Please fill this after completing 14 mg dose   Assessment and Plan    Coronary Artery Disease   Significant plaque buildup in coronary arteries is evident from the CT scan, though she remains asymptomatic with no chest pain or dyspnea. There is no immediate need for stenting or bypass surgery. Smoking cessation is crucial as continued smoking significantly reduces life expectancy. Continue aspirin 81 mg and atorvastatin 40 mg daily. Advise smoking cessation with  nicotine patches. Educate on recognizing symptoms of angina or dyspnea and to seek medical attention if she occurs.  Chronic Obstructive Pulmonary Disease (COPD)   Advanced emphysema is shown on the lung CT scan. Smoking cessation is critical to prevent further lung damage and avoid the need for oxygen therapy. Continued smoking increases the risk of cancer and further lung deterioration. Advise smoking cessation with nicotine patches and educate on the risks of continued smoking, including progression of COPD and potential need for oxygen therapy.  Tobacco Use Disorder   She smokes approximately 7-8 cigarettes per day. There is a strong emphasis on quitting smoking to improve overall health and prevent further complications related to coronary artery disease and COPD. Prescribe nicotine patches with a step-down dosing regimen to aid cessation. Provide education on the use of nicotine patches and the importance of smoking cessation.  Hypertension   Hypertension is well-controlled with the current medication regimen. Blood pressure readings are within the target range. Continue lisinopril HCT as prescribed.  Hyperlipidemia   Hyperlipidemia is well-controlled with atorvastatin. Recent cholesterol levels are within the target range. Continue atorvastatin 40 mg daily.  Goals of Care   She has a living will and is aware of the implications of her health conditions. She expressed a desire to live and avoid life support measures if quality  of life is severely compromised. Encourage discussion with family about health goals and living will.     Overall as she denies any symptoms concerning for angina or PAD, denies dyspnea in spite of emphysema, I have recommended to continue medical therapy and will consider stress testing if symptoms develop or has abnormal EKG.   Signed,  Yates Decamp, MD, [facility] 05/20/2023, 10:00 PM [facility] [address] [place], [place] [address] Phone: [phone].  Fax:  [phone]

## 2023-05-19 NOTE — Patient Instructions (Signed)
 Medication Instructions:  Start nicotine patch.  Follow label instructions.  First patch will be 21 mg, next will be 14 and then 7 mg.  *If you need a refill on your cardiac medications before your next appointment, please call your pharmacy*   Lab Work: none If you have labs (blood work) drawn today and your tests are completely normal, you will receive your results only by: MyChart Message (if you have MyChart) OR A paper copy in the mail If you have any lab test that is abnormal or we need to change your treatment, we will call you to review the results.   Testing/Procedures: none   Follow-Up: At [facility], you and your health needs are our priority.  As part of our continuing mission to provide you with exceptional heart care, we have created designated Provider Care Teams.  These Care Teams include your primary Cardiologist (physician) and Advanced Practice Providers (APPs -  Physician Assistants and Nurse Practitioners) who all work together to provide you with the care you need, when you need it.  We recommend signing up for the patient portal called "MyChart".  Sign up information is provided on this After Visit Summary.  MyChart is used to connect with patients for Virtual Visits (Telemedicine).  Patients are able to view lab/test results, encounter notes, upcoming appointments, etc.  Non-urgent messages can be sent to your provider as well.   To learn more about what you can do with MyChart, go to [email or web address].    Your next appointment:   As needed  Provider:   Yates Decamp, MD     Other Instructions

## 2023-07-13 ENCOUNTER — Encounter: Payer: Self-pay | Admitting: Family

## 2023-07-13 ENCOUNTER — Ambulatory Visit: Payer: 59 | Admitting: Family

## 2023-07-13 VITALS — BP 130/76 | HR 68 | Temp 97.8°F | Resp 19 | Ht 67.0 in | Wt 176.8 lb

## 2023-07-13 DIAGNOSIS — M170 Bilateral primary osteoarthritis of knee: Secondary | ICD-10-CM | POA: Diagnosis not present

## 2023-07-13 DIAGNOSIS — I10 Essential (primary) hypertension: Secondary | ICD-10-CM | POA: Diagnosis not present

## 2023-07-13 DIAGNOSIS — R7303 Prediabetes: Secondary | ICD-10-CM | POA: Diagnosis not present

## 2023-07-13 DIAGNOSIS — F17200 Nicotine dependence, unspecified, uncomplicated: Secondary | ICD-10-CM

## 2023-07-13 DIAGNOSIS — E782 Mixed hyperlipidemia: Secondary | ICD-10-CM

## 2023-07-13 DIAGNOSIS — I2510 Atherosclerotic heart disease of native coronary artery without angina pectoris: Secondary | ICD-10-CM

## 2023-07-13 DIAGNOSIS — Z23 Encounter for immunization: Secondary | ICD-10-CM | POA: Diagnosis not present

## 2023-07-13 MED ORDER — NICOTINE 7 MG/24HR TD PT24: 7.0000 mg | MEDICATED_PATCH | Freq: Every day | TRANSDERMAL | 0 refills | Status: DC

## 2023-07-13 NOTE — Progress Notes (Signed)
 Provider: Christean Courts FNP-C   Kaislee Chao, Elijio Guadeloupe, NP  Patient Care Team: Ricahrd Schwager, Elijio Guadeloupe, NP as PCP - General (Family Medicine) Knox Perl, MD as PCP - Cardiology (Cardiology)  Extended Emergency Contact Information Primary Emergency Contact: Nottingham,Stacy Home Phone: [phone] Mobile Phone: [phone] Relation: Brother Preferred language: English Interpreter needed? No  Code Status:  Full Code  Goals of care: Advanced Directive information    07/13/2023   10:41 AM  Advanced Directives  Does Patient Have a Medical Advance Directive? No  Would patient like information on creating a medical advance directive? No - Patient declined     Chief Complaint  Patient presents with   Medical Management of Chronic Issues    6 month follow up.    Discussed the use of AI scribe software for clinical note transcription with the patient, who gave verbal consent to proceed.  History of Present Illness   Anne Palmer is a 68 year old female with coronary artery disease, prediabetes, and high cholesterol who presents for a six-month follow-up.  She has coronary artery disease and experiences no chest pain. Her blood pressure is well-controlled with home monitoring. She engages in physical activity by walking about three times a week, often with a friend, and has gained three pounds since her last visit. Her medication regimen includes lisinopril , hydrochlorothiazide , atorvastatin , and aspirin , with no reported side effects.  Her prediabetes shows improvement in glucose levels due to dietary changes and exercise. She adheres to her prescribed medication regimen.  Her high cholesterol is well-managed, with a total cholesterol of 113 mg/dL and LDL at 48 mg/dL. She continues atorvastatin  40 mg daily.  She experiences arthritis in her knees, noting persistent popping sounds but finds relief with exercise. She uses a plant-based cream for pain relief and has a stationary bicycle at  home.  She smokes about five cigarettes a day and has attempted to reduce her smoking. She has not used nicotine  patches or Wellbutrin  in the past.  She uses albuterol  as needed for asthma, having used it once last week due to pollen exposure. No wheezing has occurred since then.  She takes calcium , Tylenol  as needed, Myrbetriq  for overactive bladder, and Fosamax  weekly for osteoporosis, with no side effects reported.  She has received a TDAP shot, one shingles shot, and a pneumonia shot today, with plans to receive the second shingles shot in June or July.    Past Medical History:  Diagnosis Date   Allergy    Arthritis    Cataract    COPD (chronic obstructive pulmonary disease) (HCC)    Diverticulitis 2015   Hematuria    Hyperlipidemia    Hypertension    Past Surgical History:  Procedure Laterality Date   ABDOMINAL HYSTERECTOMY     secondary to endometriosis and fibroids   COLONOSCOPY  2015   no polyps, diverticulosis   SHOULDER SURGERY  2008   left    Allergies  Allergen Reactions   Sulfa Antibiotics Hives    Allergies as of 07/13/2023       Reactions   Sulfa Antibiotics Hives        Medication List        Accurate as of Jul 13, 2023 11:34 AM. If you have any questions, ask your nurse or doctor.          acetaminophen  500 MG tablet Commonly known as: TYLENOL  Take 500 mg by mouth every 6 (six) hours as needed.   albuterol  108 (90 Base)  MCG/ACT inhaler Commonly known as: VENTOLIN  HFA USE 2 INHALATIONS BY MOUTH  EVERY 6 HOURS AS NEEDED FOR WHEEZING OR SHORTNESS OF  BREATH   alendronate  70 MG tablet Commonly known as: FOSAMAX  Take 1 tablet (70 mg total) by mouth once a week. Take with a full glass of water on an empty stomach.   aspirin  EC 81 MG tablet Take 1 tablet (81 mg total) by mouth daily. Swallow whole.   atorvastatin  40 MG tablet Commonly known as: LIPITOR Take 1 tablet (40 mg total) by mouth daily. E78.2   calcium  carbonate 600 MG Tabs  tablet Commonly known as: Calcium  600 Take 1 tablet (600 mg total) by mouth 2 (two) times daily with a meal.   lisinopril -hydrochlorothiazide  20-25 MG tablet Commonly known as: ZESTORETIC  TAKE 1 TABLET BY MOUTH DAILY   Myrbetriq  50 MG Tb24 tablet Generic drug: mirabegron  ER Take 50 mg by mouth daily.   nicotine  7 mg/24hr patch Commonly known as: Nicoderm CQ  Place 1 patch (7 mg total) onto the skin daily. Start taking on: Jul 16, 2023 What changed:  These instructions start on Jul 16, 2023. If you are unsure what to do until then, ask your doctor or other care provider. Another medication with the same name was removed. Continue taking this medication, and follow the directions you see here. Changed by: Elijio Guadeloupe Nica Friske        Review of Systems  Constitutional:  Negative for appetite change, chills, fatigue, fever and unexpected weight change.  HENT:  Negative for congestion, dental problem, ear discharge, ear pain, facial swelling, hearing loss, nosebleeds, postnasal drip, rhinorrhea, sinus pressure, sinus pain, sneezing, sore throat, tinnitus and trouble swallowing.   Eyes:  Negative for pain, discharge, redness, itching and visual disturbance.  Respiratory:  Negative for cough, chest tightness, shortness of breath and wheezing.   Cardiovascular:  Negative for chest pain, palpitations and leg swelling.  Gastrointestinal:  Negative for abdominal distention, abdominal pain, blood in stool, constipation, diarrhea, nausea and vomiting.  Endocrine: Negative for cold intolerance, heat intolerance, polydipsia, polyphagia and polyuria.  Genitourinary:  Negative for difficulty urinating, dysuria, flank pain, frequency and urgency.  Musculoskeletal:  Positive for arthralgias. Negative for back pain, gait problem, joint swelling, myalgias, neck pain and neck stiffness.  Skin:  Negative for color change, pallor, rash and wound.  Neurological:  Negative for dizziness, syncope, speech  difficulty, weakness, light-headedness, numbness and headaches.  Hematological:  Does not bruise/bleed easily.  Psychiatric/Behavioral:  Negative for agitation, behavioral problems, confusion, hallucinations, self-injury, sleep disturbance and suicidal ideas. The patient is not nervous/anxious.     Immunization History  Administered Date(s) Administered   Fluad Trivalent(High Dose 65+) 01/12/2023   PNEUMOCOCCAL CONJUGATE-20 07/13/2023   Pneumococcal-Unspecified 10/25/2021   Tdap 01/28/2023   Zoster Recombinant(Shingrix) 07/02/2023   Pertinent  Health Maintenance Due  Topic Date Due   MAMMOGRAM  08/28/2023   INFLUENZA VACCINE  10/02/2023   Colonoscopy  05/04/2033   DEXA SCAN  Completed      12/09/2016   10:19 AM 07/10/2022   12:56 PM 01/12/2023    8:47 AM 01/27/2023   10:16 AM 07/13/2023   10:41 AM  Fall Risk  Falls in the past year? No 0 0 0 0  Was there an injury with Fall?  0 0 0 0  Fall Risk Category Calculator  0 0 0 0  Patient at Risk for Falls Due to  No Fall Risks No Fall Risks History of fall(s) No Fall Risks  Fall risk Follow up  Falls evaluation completed Falls evaluation completed Falls evaluation completed Falls evaluation completed   Functional Status Survey:    Vitals:   07/13/23 1042  BP: 130/76  Pulse: 68  Resp: 19  Temp: 97.8 F (36.6 C)  SpO2: 98%  Weight: 176 lb 12.8 oz (80.2 kg)  Height: 5\' 7"  (1.702 m)   Body mass index is 27.69 kg/m. Physical Exam VITALS: T- 97.8, P- 68, BP- 130/76, SaO2- 98% MEASUREMENTS: Weight- 176. GENERAL: Alert, cooperative, well developed, no acute distress. HEENT: Normocephalic, normal oropharynx, moist mucous membranes. Right ear with cerumen, eardrum normal. Left ear normal. Nose without swelling. No sinus tenderness. NECK: Neck supple. CHEST: Clear to auscultation bilaterally, no wheezes, rhonchi, or crackles. CARDIOVASCULAR: Normal heart rate and rhythm, S1 and S2 normal without murmurs. ABDOMEN: Soft,  non-tender, non-distended, without organomegaly. Normal bowel sounds. Liver non-tender. EXTREMITIES: No cyanosis or edema. MUSCULOSKELETAL: Left knee with popping sound, no pain. NEUROLOGICAL: Cranial nerves grossly intact. Pupils equal, round, and reactive to light. Moves all extremities without gross motor or sensory deficit.  SKIN: No rash,no lesion or erythema   PSYCHIATRY/BEHAVIORAL: Mood stable   Labs reviewed: Recent Labs    01/12/23 0925 05/04/23 1345  NA 143 143  K 4.6 4.3  CL 105 107  CO2 30 29  GLUCOSE 99 99  BUN 12 15  CREATININE 0.61 0.65  CALCIUM  9.4 9.8   Recent Labs    01/12/23 0925 04/15/23 0818 05/04/23 1345  AST 13 14 11   ALT 15 15 14   BILITOT 0.6 0.9 0.7  PROT 7.0 6.9 7.2   Recent Labs    01/12/23 0925 05/04/23 1345  WBC 4.9 5.4  NEUTROABS 2,617 2,279  HGB 14.7 14.3  HCT 44.6 45.2*  MCV 95.9 95.0  PLT 196 219   Lab Results  Component Value Date   TSH 1.21 05/04/2023   Lab Results  Component Value Date   HGBA1C 5.9 (H) 04/15/2023   Lab Results  Component Value Date   CHOL 113 05/04/2023   HDL 45 (L) 05/04/2023   LDLCALC 48 05/04/2023   TRIG 112 05/04/2023   CHOLHDL 2.5 05/04/2023    Significant Diagnostic Results in last 30 days:  No results found.  Assessment/Plan    Wellness Visit Routine six-month follow-up. Weight increased from 173 to 176 pounds. Engages in walking exercise three times a week. Up to date with TDAP, shingles, and pneumonia vaccinations. No new concerns. - Continue current exercise regimen - Maintain up-to-date vaccinations  Coronary artery disease No chest pain. Blood pressure well-controlled at home. Continues aspirin  and atorvastatin . LDL cholesterol at 48, below target of 70 due to previous plaque. - Continue aspirin  81 mg daily - Continue atorvastatin  40 mg daily - Monitor blood pressure regularly at home  Hyperlipidemia Cholesterol well-managed with atorvastatin . Total cholesterol 113, improved  from 120. HDL 45, could be improved with exercise. Triglycerides 112, below target of 150. - Continue atorvastatin  40 mg daily - Encourage regular exercise to improve HDL levels  Prediabetes Glucose levels well-controlled with dietary changes and exercise. Recent lab work shows improvement. - Continue dietary modifications and regular exercise  Osteoarthritis of knee Persistent knee pain with popping sounds, especially in the left knee. Exercise and plant-based cream provide some relief. Advised to avoid sugary drinks. - Continue exercise regimen - Use plant-based cream for pain relief - Avoid sugary drinks  Osteoporosis Continues Fosamax  weekly. Advised to sit up for 30 minutes post-dose. - Continue Fosamax  weekly -  Ensure proper administration by sitting up for 30 minutes post-dose  Nicotine  dependence Smokes five cigarettes daily. Willing to try nicotine  patches. Previous Wellbutrin  use ineffective. Discussed starting with lower dose of nicotine  patch due to smoking frequency. - Prescribe nicotine  patches, starting with a lower dose - Send prescription to [facility] for insurance coverage check    Family/ staff Communication: Reviewed plan of care with patient verbalized understanding   Labs/tests ordered:  - CBC with Differential/Platelet - CMP with eGFR([facility]) - TSH - Lipid panel  Next Appointment : Return in about 6 months (around 01/13/2024) for medical mangement of chronic issues., Fasting labs in 6 months prior to visit.   Spent 30 minutes of Face to face and non-face to face with patient  >50% time spent counseling; reviewing medical record; tests; labs; documentation and developing future plan of care.   Estil Heman, NP

## 2023-09-29 ENCOUNTER — Other Ambulatory Visit: Payer: Self-pay | Admitting: Family

## 2023-10-08 ENCOUNTER — Other Ambulatory Visit: Payer: Self-pay | Admitting: Family

## 2023-10-08 NOTE — Telephone Encounter (Signed)
 Copied from CRM [id number]. Topic: Clinical - Medication Refill >> Oct 08, 2023  1:06 PM Carrielelia G wrote: Medication: albuterol  (VENTOLIN  HFA) 108 (90 Base) MCG/ACT inhaler  Has the patient contacted their pharmacy? Yes (Agent: If no, request that the patient contact the pharmacy for the refill. If patient does not wish to contact the pharmacy document the reason why and proceed with request.) (Agent: If yes, when and what did the pharmacy advise?)   [facility] - [place], [place] - [address] [facility] [address] [place] [address] [place]  [address] Phone: [phone] Fax: [phone]  Is this the correct pharmacy for this prescription? Yes If no, delete pharmacy and type the correct one.    Is the patient out of the medication? No  Has the patient been seen for an appointment in the last year OR does the patient have an upcoming appointment? Yes  Can we respond through MyChart? No  Agent: Please be advised that Rx refills may take up to 3 business days. We ask that you follow-up with your pharmacy.

## 2023-10-30 DIAGNOSIS — H04123 Dry eye syndrome of bilateral lacrimal glands: Secondary | ICD-10-CM | POA: Diagnosis not present

## 2023-10-30 DIAGNOSIS — H43393 Other vitreous opacities, bilateral: Secondary | ICD-10-CM | POA: Diagnosis not present

## 2023-11-04 ENCOUNTER — Other Ambulatory Visit: Payer: Self-pay | Admitting: Family

## 2023-11-04 MED ORDER — ALBUTEROL SULFATE HFA 108 (90 BASE) MCG/ACT IN AERS: INHALATION_SPRAY | RESPIRATORY_TRACT | 3 refills | Status: AC

## 2023-11-04 NOTE — Telephone Encounter (Signed)
 Copied from CRM #[id number]. Topic: Clinical - Medication Refill >> Nov 04, 2023  9:03 AM Carrielelia G wrote: Medication: albuterol  (VENTOLIN  HFA) 108 (90 Base) MCG/ACT inhaler [[id number]]  Has the patient contacted their pharmacy? Yes (Agent: If no, request that the patient contact the pharmacy for the refill. If patient does not wish to contact the pharmacy document the reason why and proceed with request.) (Agent: If yes, when and what did the pharmacy advise?)  This is the patient's preferred pharmacy:   [facility] - [place], [place] - [address] [place] [address] Phone: [phone] Fax: [phone]  GIs this the correct pharmacy for this prescription? Yes If no, delete pharmacy and type the correct one.    Is the patient out of the medication? Yes  Has the patient been seen for an appointment in the last year OR does the patient have an upcoming appointment? Yes  Can we respond through MyChart? Yes  Agent: Please be advised that Rx refills may take up to 3 business days. We ask that you follow-up with your pharmacy.

## 2023-11-06 ENCOUNTER — Other Ambulatory Visit: Payer: Self-pay | Admitting: Family

## 2023-11-06 DIAGNOSIS — Z1231 Encounter for screening mammogram for malignant neoplasm of breast: Secondary | ICD-10-CM

## 2023-11-11 ENCOUNTER — Ambulatory Visit
Admission: RE | Admit: 2023-11-11 | Discharge: 2023-11-11 | Disposition: A | Discharge status: Home / Self Care | Source: Ambulatory Visit

## 2023-11-11 DIAGNOSIS — Z1231 Encounter for screening mammogram for malignant neoplasm of breast: Secondary | ICD-10-CM | POA: Diagnosis not present

## 2023-11-18 ENCOUNTER — Other Ambulatory Visit: Payer: Self-pay | Admitting: Family

## 2023-11-18 DIAGNOSIS — E782 Mixed hyperlipidemia: Secondary | ICD-10-CM

## 2024-01-11 ENCOUNTER — Other Ambulatory Visit

## 2024-01-11 ENCOUNTER — Other Ambulatory Visit: Payer: Self-pay

## 2024-01-12 LAB — COMPLETE METABOLIC PANEL WITHOUT GFR
AG Ratio: 1.7 (calc) (ref 1.0–2.5)
ALT: 9 U/L (ref 6–29)
AST: 11 U/L (ref 10–35)
Albumin: 4.5 g/dL (ref 3.6–5.1)
Alkaline phosphatase (APISO): 77 U/L (ref 37–153)
BUN: 11 mg/dL (ref 7–25)
CO2: 28 mmol/L (ref 20–32)
Calcium: 9.5 mg/dL (ref 8.6–10.4)
Chloride: 109 mmol/L (ref 98–110)
Creat: 0.51 mg/dL (ref 0.50–1.05)
Globulin: 2.6 g/dL (ref 1.9–3.7)
Glucose, Bld: 97 mg/dL (ref 65–99)
Potassium: 4.4 mmol/L (ref 3.5–5.3)
Sodium: 144 mmol/L (ref 135–146)
Total Bilirubin: 0.8 mg/dL (ref 0.2–1.2)
Total Protein: 7.1 g/dL (ref 6.1–8.1)

## 2024-01-12 LAB — CBC WITH DIFFERENTIAL/PLATELET
Absolute Lymphocytes: 1896 {cells}/uL (ref 850–3900)
Absolute Monocytes: 282 {cells}/uL (ref 200–950)
Basophils Absolute: 12 {cells}/uL (ref 0–200)
Basophils Relative: 0.2 %
Eosinophils Absolute: 48 {cells}/uL (ref 15–500)
Eosinophils Relative: 0.8 %
HCT: 46.3 % — ABNORMAL HIGH (ref 35.0–45.0)
Hemoglobin: 15 g/dL (ref 11.7–15.5)
MCH: 30.7 pg (ref 27.0–33.0)
MCHC: 32.4 g/dL (ref 32.0–36.0)
MCV: 94.9 fL (ref 80.0–100.0)
MPV: 12.6 fL — ABNORMAL HIGH (ref 7.5–12.5)
Monocytes Relative: 4.7 %
Neutro Abs: 3762 {cells}/uL (ref 1500–7800)
Neutrophils Relative %: 62.7 %
Platelets: 212 Thousand/uL (ref 140–400)
RBC: 4.88 Million/uL (ref 3.80–5.10)
RDW: 12.6 % (ref 11.0–15.0)
Total Lymphocyte: 31.6 %
WBC: 6 Thousand/uL (ref 3.8–10.8)

## 2024-01-12 LAB — HEMOGLOBIN A1C
Hgb A1c MFr Bld: 5.7 % — ABNORMAL HIGH (ref <5.7)
Mean Plasma Glucose: 117 mg/dL
eAG (mmol/L): 6.5 mmol/L

## 2024-01-12 LAB — LIPID PANEL
Cholesterol: 124 mg/dL (ref <200)
HDL: 49 mg/dL — ABNORMAL LOW (ref >=50)
LDL Cholesterol (Calc): 57 mg/dL
Non-HDL Cholesterol (Calc): 75 mg/dL (ref <130)
Total CHOL/HDL Ratio: 2.5 (calc) (ref <5.0)
Triglycerides: 98 mg/dL (ref <150)

## 2024-01-12 LAB — TSH: TSH: 1.05 m[IU]/L (ref 0.40–4.50)

## 2024-01-13 ENCOUNTER — Ambulatory Visit: Payer: Self-pay | Admitting: Family

## 2024-01-13 ENCOUNTER — Encounter: Payer: Self-pay | Admitting: Family

## 2024-01-13 VITALS — BP 128/78 | HR 63 | Temp 97.6°F | Resp 19 | Ht 67.0 in | Wt 175.0 lb

## 2024-01-13 DIAGNOSIS — M810 Age-related osteoporosis without current pathological fracture: Secondary | ICD-10-CM

## 2024-01-13 DIAGNOSIS — F17209 Nicotine dependence, unspecified, with unspecified nicotine-induced disorders: Secondary | ICD-10-CM

## 2024-01-13 DIAGNOSIS — I10 Essential (primary) hypertension: Secondary | ICD-10-CM | POA: Diagnosis not present

## 2024-01-13 DIAGNOSIS — E559 Vitamin D deficiency, unspecified: Secondary | ICD-10-CM

## 2024-01-13 DIAGNOSIS — E782 Mixed hyperlipidemia: Secondary | ICD-10-CM | POA: Diagnosis not present

## 2024-01-13 DIAGNOSIS — R7303 Prediabetes: Secondary | ICD-10-CM | POA: Diagnosis not present

## 2024-01-13 DIAGNOSIS — I2510 Atherosclerotic heart disease of native coronary artery without angina pectoris: Secondary | ICD-10-CM | POA: Diagnosis not present

## 2024-01-24 NOTE — Progress Notes (Signed)
 Provider: Roxan Plough FNP-C   Sheniqua Carolan, Roxan BROCKS, NP  Patient Care Team: Philippe Gang, Roxan BROCKS, NP as PCP - General (Family Medicine) Ladona Heinz, MD as PCP - Cardiology (Cardiology)  Extended Emergency Contact Information Primary Emergency Contact: Brucato,Stacy Home Phone: [phone] Mobile Phone: [phone] Relation: Brother Preferred language: English Interpreter needed? No  Code Status: Full code Goals of care: Advanced Directive information    01/13/2024    9:52 AM  Advanced Directives  Does Patient Have a Medical Advance Directive? No  Would patient like information on creating a medical advance directive? No - Patient declined     Chief Complaint  Patient presents with   Medical Management of Chronic Issues    6 Month follow up.    Discussed the use of AI scribe software for clinical note transcription with the patient, who gave verbal consent to proceed.  History of Present Illness   Anne Palmer is a 68 year old female with hypertension, coronary artery disease, and prediabetes who presents for a six-month follow-up.  She has not experienced any recent chest pain related to her coronary artery disease. Her blood pressure today was 128/78 mmHg. She attributes her blood pressure control to walking about three times a week. She takes lisinopril  combined with hydrochlorothiazide  20/25 mg for blood pressure management.  Her prediabetes is improving, with her A1c decreasing from 5.9% nine months ago to 5.7% currently, and her glucose level is 97 mg/dL. She credits increased physical activity and dietary changes, such as eating more salads, for this improvement.  She has osteoporosis and takes Fosamax  70 mg once a week, along with calcium  tablets twice daily and vitamin D3 supplements. She denies any recent falls or fractures.  Her cholesterol levels have changed slightly; total cholesterol is 124 mg/dL, up from 886 mg/dL, and LDL cholesterol increased from 48 mg/dL  to 57 mg/dL, but remains below the target of 70 mg/dL. She takes atorvastatin  40 mg.  She has a history of smoking and smoked about three cigarettes yesterday. She has not used nicotine  patches due to cost and is considering other methods to reduce smoking.  She takes aspirin  81 mg daily without issues of bleeding. She uses Tylenol  as needed and has not used her albuterol  inhaler recently. She takes Mirabegron  (Myrbetriq ) for overactive bladder.  She mentions a family history of cancer on her father's side and encourages her daughter to undergo regular screenings.   Past Medical History:  Diagnosis Date   Allergy    Arthritis    Cataract    COPD (chronic obstructive pulmonary disease) (HCC)    Diverticulitis 2015   Hematuria    Hyperlipidemia    Hypertension    Past Surgical History:  Procedure Laterality Date   ABDOMINAL HYSTERECTOMY     secondary to endometriosis and fibroids   COLONOSCOPY  2015   no polyps, diverticulosis   SHOULDER SURGERY  2008   left    Allergies  Allergen Reactions   Sulfa Antibiotics Hives    Allergies as of 01/13/2024       Reactions   Sulfa Antibiotics Hives        Medication List        Accurate as of January 13, 2024 11:59 PM. If you have any questions, ask your nurse or doctor.          STOP taking these medications    nicotine  7 mg/24hr patch Commonly known as: Nicoderm CQ  Stopped by: Corri Delapaz C Jaliana Medellin  TAKE these medications    acetaminophen  500 MG tablet Commonly known as: TYLENOL  Take 500 mg by mouth every 6 (six) hours as needed.   albuterol  108 (90 Base) MCG/ACT inhaler Commonly known as: VENTOLIN  HFA USE 2 INHALATIONS BY MOUTH  EVERY 6 HOURS AS NEEDED FOR WHEEZING OR SHORTNESS OF  BREATH   alendronate  70 MG tablet Commonly known as: FOSAMAX  TAKE 1 TABLET BY MOUTH WEEKLY  WITH A FULL GLASS OF WATER ON AN EMPTY STOMACH   aspirin  EC 81 MG tablet Take 1 tablet (81 mg total) by mouth daily. Swallow  whole.   atorvastatin  40 MG tablet Commonly known as: LIPITOR TAKE 1 TABLET BY MOUTH DAILY   calcium  carbonate 600 MG Tabs tablet Commonly known as: Calcium  600 Take 1 tablet (600 mg total) by mouth 2 (two) times daily with a meal.   lisinopril -hydrochlorothiazide  20-25 MG tablet Commonly known as: ZESTORETIC  TAKE 1 TABLET BY MOUTH DAILY   Myrbetriq  50 MG Tb24 tablet Generic drug: mirabegron  ER Take 50 mg by mouth daily.        Review of Systems  Constitutional:  Negative for appetite change, chills, fatigue, fever and unexpected weight change.  HENT:  Negative for congestion, dental problem, ear discharge, ear pain, facial swelling, hearing loss, nosebleeds, postnasal drip, rhinorrhea, sinus pressure, sinus pain, sneezing, sore throat, tinnitus and trouble swallowing.   Eyes:  Negative for pain, discharge, redness, itching and visual disturbance.  Respiratory:  Negative for cough, chest tightness, shortness of breath and wheezing.   Cardiovascular:  Negative for chest pain, palpitations and leg swelling.  Gastrointestinal:  Negative for abdominal distention, abdominal pain, blood in stool, constipation, diarrhea, nausea and vomiting.  Endocrine: Negative for cold intolerance, heat intolerance, polydipsia, polyphagia and polyuria.  Genitourinary:  Negative for difficulty urinating, dysuria, flank pain, frequency and urgency.  Musculoskeletal:  Negative for arthralgias, back pain, gait problem, joint swelling, myalgias, neck pain and neck stiffness.  Skin:  Negative for color change, pallor, rash and wound.  Neurological:  Negative for dizziness, syncope, speech difficulty, weakness, light-headedness, numbness and headaches.  Hematological:  Does not bruise/bleed easily.  Psychiatric/Behavioral:  Negative for agitation, behavioral problems, confusion, hallucinations, self-injury, sleep disturbance and suicidal ideas. The patient is not nervous/anxious.     Immunization History   Administered Date(s) Administered   Fluad Trivalent(High Dose 65+) 01/12/2023   Influenza-Unspecified 12/05/2023   PNEUMOCOCCAL CONJUGATE-20 07/13/2023   Pneumococcal-Unspecified 10/25/2021   Tdap 01/28/2023   Zoster Recombinant(Shingrix) 07/02/2023, 12/05/2023   Pertinent  Health Maintenance Due  Topic Date Due   Mammogram  11/10/2025   Colonoscopy  05/04/2033   Influenza Vaccine  Completed   Bone Density Scan  Completed      07/10/2022   12:56 PM 01/12/2023    8:47 AM 01/27/2023   10:16 AM 07/13/2023   10:41 AM 01/13/2024    9:52 AM  Fall Risk  Falls in the past year? 0 0 0 0 0  Was there an injury with Fall? 0 0 0 0 0  Fall Risk Category Calculator 0 0 0 0 0  Patient at Risk for Falls Due to No Fall Risks No Fall Risks History of fall(s) No Fall Risks No Fall Risks  Fall risk Follow up Falls evaluation completed Falls evaluation completed Falls evaluation completed Falls evaluation completed Falls evaluation completed   Functional Status Survey:    Vitals:   01/13/24 0955  BP: 128/78  Pulse: 63  Resp: 19  Temp: 97.6 F (36.4 C)  SpO2: 98%  Weight: 175 lb (79.4 kg)  Height: 5' 7 (1.702 m)   Body mass index is 27.41 kg/m. Physical Exam VITALS: T- 97.6, P- 63, BP- 128/78, SaO2- 98% MEASUREMENTS: Weight- 175. GENERAL: Alert, cooperative, well developed, no acute distress HEENT: Normocephalic, normal oropharynx, moist mucous membranes, eardrums normal bilaterally, nose normal NECK: No lymphadenopathy, thyroid  normal CHEST: Clear to auscultation bilaterally, no wheezes, rhonchi, or crackles CARDIOVASCULAR: Normal heart rate and rhythm, S1 and S2 normal without murmurs ABDOMEN: Soft, non-tender, non-distended, without organomegaly, normal bowel sounds EXTREMITIES: No cyanosis or edema NEUROLOGICAL: Cranial nerves grossly intact, moves all extremities without gross motor or sensory deficit  SKIN: No rash,no lesion or erythema   PSYCHIATRY/BEHAVIORAL: Mood stable    Labs reviewed: Recent Labs    05/04/23 1345 01/11/24 0900  NA 143 144  K 4.3 4.4  CL 107 109  CO2 29 28  GLUCOSE 99 97  BUN 15 11  CREATININE 0.65 0.51  CALCIUM  9.8 9.5   Recent Labs    04/15/23 0818 05/04/23 1345 01/11/24 0900  AST 14 11 11   ALT 15 14 9   BILITOT 0.9 0.7 0.8  PROT 6.9 7.2 7.1   Recent Labs    05/04/23 1345 01/11/24 0900  WBC 5.4 6.0  NEUTROABS 2,279 3,762  HGB 14.3 15.0  HCT 45.2* 46.3*  MCV 95.0 94.9  PLT 219 212   Lab Results  Component Value Date   TSH 1.05 01/11/2024   Lab Results  Component Value Date   HGBA1C 5.7 (H) 01/11/2024   Lab Results  Component Value Date   CHOL 124 01/11/2024   HDL 49 (L) 01/11/2024   LDLCALC 57 01/11/2024   TRIG 98 01/11/2024   CHOLHDL 2.5 01/11/2024    Significant Diagnostic Results in last 30 days:  No results found.  Assessment/Plan   Adult Wellness Visit Routine wellness visit with well-controlled blood pressure at 128/78 mmHg, heart rate at 63 bpm, and oxygen saturation at 98%. Weight decreased from 178 lbs to 175 lbs. No chest pain, falls, or fractures. Regular walking exercise performed three times a week. Blood work shows improved A1c from 5.9% to 5.7%, glucose at 97 mg/dL, normal kidney and liver function, and thyroid  level at 1.05. Cholesterol levels: total cholesterol 124 mg/dL, triglycerides 98 mg/dL, LDL 57 mg/dL. No signs of infection or anemia. No issues with acid reflux or urinary symptoms. No wheezing or tenderness on examination. - Continue current exercise regimen and dietary modifications. - Ordered lab work for next visit. - Scheduled phone visit on December 1st.  Essential hypertension Blood pressure is well-controlled at 128/78 mmHg with current medication regimen. - Continue lisinopril  20 mg and hydrochlorothiazide  25 mg daily.  Mixed hyperlipidemia and atherosclerotic heart disease of native coronary artery without angina Cholesterol levels are at goal with total  cholesterol at 124 mg/dL, triglycerides at 98 mg/dL, and LDL at 57 mg/dL. No chest pain reported. Current medication regimen includes atorvastatin  40 mg and aspirin  81 mg. - Continue atorvastatin  40 mg daily. - Continue aspirin  81 mg daily.  Prediabetes A1c improved from 5.9% to 5.7% with lifestyle modifications. Glucose level is 97 mg/dL. Continued dietary changes and exercise are recommended to further reduce A1c below 5.7%. - Continue dietary modifications to reduce sugar and carbohydrate intake. - Continue regular exercise regimen.  Age-related osteoporosis without current pathological fracture and vitamin D  deficiency No falls or fractures reported. Continues Fosamax  70 mg weekly and calcium  supplementation. Vitamin D3 supplementation is also taken. - Continue Fosamax   70 mg weekly. - Continue calcium  supplementation twice daily. - Consider separate vitamin D3 supplementation.  Nicotine  dependence Currently smoking approximately three cigarettes per day. No use of nicotine  patches due to cost. Discussed alternative methods for stress management, including deep breathing and Wellbutrin  as a potential option. - Encouraged reduction in smoking. - Discussed alternative stress management techniques.  Overactive bladder Currently managed with Mobetriq, which is effective. - Continue Mobetriq as prescribed.  Cataract, right eye Cataract in the right eye with no change in vision. Regular follow-up with eye doctor. - Continue regular follow-up with eye doctor.   Family/ staff Communication: Reviewed plan of care with patient verbalized understanding  Labs/tests ordered:  - CBC with Differential/Platelet - CMP with eGFR([facility]) - TSH - Hgb A1C - Lipid panel  Next Appointment : Return in about 6 months (around 07/12/2024) for medical mangement of chronic issues., Fasting labs in 6 months prior to visit.   Spent 30 minutes of Face to face and non-face to face with patient  >50% time  spent counseling; reviewing medical record; tests; labs; documentation and developing future plan of care.   Roxan JAYSON Plough, NP

## 2024-02-01 ENCOUNTER — Encounter: Payer: Self-pay | Admitting: Family

## 2024-02-01 ENCOUNTER — Ambulatory Visit: Payer: 59 | Admitting: Family

## 2024-02-01 DIAGNOSIS — Z0000 Encounter for general adult medical examination without abnormal findings: Secondary | ICD-10-CM

## 2024-02-01 NOTE — Progress Notes (Signed)
 This service is provided via telemedicine  No vital signs collected/recorded due to the encounter was a telemedicine visit.   Location of patient (ex: home, work):  Home  Patient consents to a telephone visit: Yes  Location of the provider (ex: office, home):  [facility], Office   Name of any referring provider:  N/A  Names of all persons participating in the telemedicine service and their role in the encounter:  Shelli Ferrier, CMA, Patient, and Brailey Buescher, Anne BROCKS, NP   Time spent on call:  9 min with medical assistant      Chief Complaint  Patient presents with   [facility] Wellness    Annual Wellness Visit.     Subjective:   Anne Palmer is a 68 y.o. female who presents for a Medicare Annual Wellness Visit.  Allergies (verified) Sulfa antibiotics   History: Past Medical History:  Diagnosis Date   Allergy    Arthritis    Cataract    COPD (chronic obstructive pulmonary disease) (HCC)    Diverticulitis 2015   Hematuria    Hyperlipidemia    Hypertension    Past Surgical History:  Procedure Laterality Date   ABDOMINAL HYSTERECTOMY     secondary to endometriosis and fibroids   COLONOSCOPY  2015   no polyps, diverticulosis   SHOULDER SURGERY  2008   left   Family History  Problem Relation Age of Onset   Arthritis Mother    Stroke Mother    Hypertension Mother    Diabetes Mother    Alcohol abuse Father    Arthritis Sister    Hyperlipidemia Sister    Diabetes Sister    Lupus Sister    Arthritis Brother    Diabetes Brother    Colon cancer Neg Hx    Colon polyps Neg Hx    Esophageal cancer Neg Hx    Rectal cancer Neg Hx    Stomach cancer Neg Hx    Breast cancer Neg Hx    Social History   Occupational History   Not on file  Tobacco Use   Smoking status: Every Day    Current packs/day: 0.25    Types: Cigarettes   Smokeless tobacco: Never  Vaping Use   Vaping status: Not on file  Substance and Sexual Activity    Alcohol use: Yes    Comment: occasional   Drug use: No   Sexual activity: Not Currently    Birth control/protection: None   Tobacco Counseling Ready to quit: Not Answered Counseling given: Not Answered  SDOH Screenings   Food Insecurity: No Food Insecurity (02/01/2024)  Housing: Low Risk  (02/01/2024)  Transportation Needs: No Transportation Needs (02/01/2024)  Utilities: Not At Risk (02/01/2024)  Depression (PHQ2-9): Low Risk  (02/01/2024)  Financial Resource Strain: Low Risk  (01/12/2024)  Physical Activity: Insufficiently Active (01/12/2024)  Social Connections: Unknown (01/12/2024)  Stress: No Stress Concern Present (01/12/2024)  Tobacco Use: High Risk (02/01/2024)   See flowsheets for full screening details  Depression Screen PHQ 2 & 9 Depression Scale- Over the past 2 weeks, how often have you been bothered by any of the following problems? Little interest or pleasure in doing things: 0 Feeling down, depressed, or hopeless (PHQ Adolescent also includes...irritable): 0 PHQ-2 Total Score: 0     Goals Addressed   None    Visit info / Clinical Intake: Medicare Wellness Visit Type:: Initial Annual Wellness Visit Persons participating in visit:: patient Medicare Wellness Visit Mode:: Video Because  this visit was a virtual/telehealth visit:: unable to obtan vitals due to lack of equipment If Telephone or Video please confirm:: I connected with the patient using audio/video enabled telemedicine application and verified that I am speaking with the correct person using two identifiers; I discussed the limitations of evaluation and management by telemedicine; The patient expressed understanding and agreed to proceed Information given by:: patient Interpreter Needed?: No Pre-visit prep was completed: yes AWV questionnaire completed by patient prior to visit?: yes Date:: 01/31/24 Living arrangements:: (!) lives alone Patient's Overall Health Status Rating: good Typical amount of  pain: some Does pain affect daily life?: no Are you currently prescribed opioids?: no  Dietary Habits and Nutritional Risks How many meals a day?: 4 Eats fruit and vegetables daily?: yes Most meals are obtained by: preparing own meals In the last 2 weeks, have you had any of the following?: none Diabetic:: no  Functional Status Activities of Daily Living (to include ambulation/medication): (Patient-Rptd) Independent Ambulation: (Patient-Rptd) Independent Home Management: (Patient-Rptd) Independent  Fall Screening Falls in the past year?: 0 Number of falls in past year: 0 Was there an injury with Fall?: 0 Fall Risk Category Calculator: 0 Patient Fall Risk Level: Low Fall Risk  Fall Risk Patient at Risk for Falls Due to: No Fall Risks Fall risk Follow up: Falls evaluation completed  Cognitive Assessment Difficulty concentrating, remembering, or making decisions? : no Will 6CIT or Mini Cog be Completed: yes What year is it?: 0 points What month is it?: 0 points Give patient an address phrase to remember (5 components): [address] [place]  About what time is it?: 0 points Count backwards from 20 to 1: 0 points Say the months of the year in reverse: 0 points Repeat the address phrase from earlier: 0 points 6 CIT Score: 0 points  Advance Directives (For Healthcare) Does Patient Have a Medical Advance Directive?: No Would patient like information on creating a medical advance directive?: No - Patient declined        Objective:    There were no vitals filed for this visit. There is no height or weight on file to calculate BMI.  Current Medications (verified) Outpatient Encounter Medications as of 02/01/2024  Medication Sig   acetaminophen  (TYLENOL ) 500 MG tablet Take 500 mg by mouth every 6 (six) hours as needed.   albuterol  (VENTOLIN  HFA) 108 (90 Base) MCG/ACT inhaler USE 2 INHALATIONS BY MOUTH  EVERY 6 HOURS AS NEEDED FOR WHEEZING OR SHORTNESS OF  BREATH    alendronate  (FOSAMAX ) 70 MG tablet TAKE 1 TABLET BY MOUTH WEEKLY  WITH A FULL GLASS OF WATER ON AN EMPTY STOMACH   aspirin  EC 81 MG tablet Take 1 tablet (81 mg total) by mouth daily. Swallow whole.   atorvastatin  (LIPITOR) 40 MG tablet TAKE 1 TABLET BY MOUTH DAILY   calcium  carbonate (CALCIUM  600) 600 MG TABS tablet Take 1 tablet (600 mg total) by mouth 2 (two) times daily with a meal.   lisinopril -hydrochlorothiazide  (ZESTORETIC ) 20-25 MG tablet TAKE 1 TABLET BY MOUTH DAILY   MYRBETRIQ  50 MG TB24 tablet Take 50 mg by mouth daily.   No facility-administered encounter medications on file as of 02/01/2024.   Hearing/Vision screen Hearing Screening - Comments:: No hearing issues. Vision Screening - Comments:: Eye exam July 2025 no changes in cataract.[facility] [place]. Immunizations and Health Maintenance Health Maintenance  Topic Date Due   Medicare Annual Wellness (AWV)  01/27/2024   COVID-19 Vaccine (1 - 2025-26  season) 01/28/2029 (Originally 11/02/2023)   Mammogram  11/10/2025   DTaP/Tdap/Td (2 - Td or Tdap) 01/27/2033   Colonoscopy  05/04/2033   Pneumococcal Vaccine: 50+ Years  Completed   Influenza Vaccine  Completed   Bone Density Scan  Completed   Hepatitis C Screening  Completed   Zoster Vaccines- Shingrix  Completed   Meningococcal B Vaccine  Aged Out        Assessment/Plan:  This is a routine wellness examination for Anne Palmer.  Patient Care Team: Donney Caraveo C, NP as PCP - General (Family Medicine) Ladona Heinz, MD as PCP - Cardiology (Cardiology)  I have personally reviewed and noted the following in the patient's chart:   Medical and social history Use of alcohol, tobacco or illicit drugs  Current medications and supplements including opioid prescriptions. Functional ability and status Nutritional status Physical activity Advanced directives List of other physicians Hospitalizations, surgeries, and ER visits in previous 12  months Vitals Screenings to include cognitive, depression, and falls Referrals and appointments  No orders of the defined types were placed in this encounter.  In addition, I have reviewed and discussed with patient certain preventive protocols, quality metrics, and best practice recommendations. A written personalized care plan for preventive services as well as general preventive health recommendations were provided to patient.   Anne Palmer Anne Suares, NP   02/01/2024   No follow-ups on file.  After Visit Summary: (MyChart) Due to this being a telephonic visit, the after visit summary with patients personalized plan was offered to patient via MyChart   Nurse Notes: Up to date on immunization except declined COVID-19 vaccine.   I connected with  Anne Palmer on 02/01/24 by a video enabled telemedicine application and verified that I am speaking with the correct person using two identifiers.   I discussed the limitations of evaluation and management by telemedicine. The patient expressed understanding and agreed to proceed.  Spent 20 minutes of non-face to face with patient  >50% time spent counseling; AWV and developing future plan of care.

## 2024-02-11 ENCOUNTER — Telehealth: Payer: Self-pay | Admitting: *Deleted

## 2024-02-11 NOTE — Telephone Encounter (Signed)
 No paperwork received.may place paper work on my desk to be signed when received.

## 2024-02-11 NOTE — Telephone Encounter (Signed)
 Please Advise.  Forwarded to Dinah and Alondra.

## 2024-02-11 NOTE — Telephone Encounter (Signed)
 Spoke with the patient and let her know we have not received any paperwork. She stated she will ask them to resend.

## 2024-02-11 NOTE — Telephone Encounter (Signed)
 I have not seen anything for this patient. Waiting for Dinah to respond if she recalls seeing any paperwork before I call the patient back.

## 2024-02-11 NOTE — Telephone Encounter (Signed)
 Copied from CRM [id number]. Topic: General - Other >> Feb 11, 2024 11:47 AM Debby BROCKS wrote: Reason for CRM: Patient states that [facility] sent over a fax to [facility] due to an insurance changed and they have not received anything back. Advised patient to follow up to see if the fax information can be completed.  Patient did not have [facility] fax # but does have the correct [facility] fax #

## 2024-02-27 ENCOUNTER — Other Ambulatory Visit: Payer: Self-pay | Admitting: Family

## 2024-07-14 ENCOUNTER — Ambulatory Visit: Admitting: Family

## 2025-02-02 ENCOUNTER — Ambulatory Visit: Admitting: Family
